# Patient Record
Sex: Female | Born: 1940
Health system: Southern US, Community
[De-identification: ages and names within clinical notes are randomized; demographics above are authoritative.]

## PROBLEM LIST (undated history)

## (undated) DIAGNOSIS — G629 Polyneuropathy, unspecified: Secondary | ICD-10-CM

## (undated) DIAGNOSIS — I10 Essential (primary) hypertension: Secondary | ICD-10-CM

## (undated) DIAGNOSIS — M1712 Unilateral primary osteoarthritis, left knee: Secondary | ICD-10-CM

## (undated) DIAGNOSIS — M1731 Unilateral post-traumatic osteoarthritis, right knee: Secondary | ICD-10-CM

## (undated) DIAGNOSIS — Z96652 Presence of left artificial knee joint: Secondary | ICD-10-CM

## (undated) DIAGNOSIS — F32A Depression, unspecified: Secondary | ICD-10-CM

## (undated) DIAGNOSIS — N393 Stress incontinence (female) (male): Secondary | ICD-10-CM

## (undated) DIAGNOSIS — K219 Gastro-esophageal reflux disease without esophagitis: Secondary | ICD-10-CM

## (undated) DIAGNOSIS — F329 Major depressive disorder, single episode, unspecified: Secondary | ICD-10-CM

## (undated) DIAGNOSIS — G4730 Sleep apnea, unspecified: Secondary | ICD-10-CM

## (undated) DIAGNOSIS — S92902A Unspecified fracture of left foot, initial encounter for closed fracture: Secondary | ICD-10-CM

## (undated) DIAGNOSIS — N939 Abnormal uterine and vaginal bleeding, unspecified: Secondary | ICD-10-CM

## (undated) DIAGNOSIS — E669 Obesity, unspecified: Secondary | ICD-10-CM

## (undated) DIAGNOSIS — M6530 Trigger finger, unspecified finger: Secondary | ICD-10-CM

## (undated) HISTORY — DX: Unilateral primary osteoarthritis, left knee: M17.12

## (undated) HISTORY — PX: BACK SURGERY: SHX140

## (undated) HISTORY — DX: Obesity, unspecified: E66.9

## (undated) HISTORY — DX: Stress incontinence (female) (male): N39.3

## (undated) HISTORY — DX: Gastro-esophageal reflux disease without esophagitis: K21.9

## (undated) HISTORY — DX: Sleep apnea, unspecified: G47.30

## (undated) HISTORY — PX: APPENDECTOMY: SHX54

## (undated) HISTORY — DX: Essential (primary) hypertension: I10

## (undated) HISTORY — PX: TUBAL LIGATION: SHX77

## (undated) HISTORY — DX: Abnormal uterine and vaginal bleeding, unspecified: N93.9

## (undated) HISTORY — PX: BLADDER SURGERY: SHX569

## (undated) HISTORY — DX: Unspecified fracture of left foot, initial encounter for closed fracture: S92.902A

## (undated) HISTORY — DX: Trigger finger, unspecified finger: M65.30

---

## 1985-06-26 HISTORY — PX: BREAST EXCISIONAL BIOPSY: SUR124

## 1993-05-26 HISTORY — PX: LUMBAR DISC SURGERY: SHX700

## 1999-01-20 ENCOUNTER — Other Ambulatory Visit: Admission: RE | Admit: 1999-01-20 | Discharge: 1999-01-20 | Payer: Self-pay | Admitting: Obstetrics and Gynecology

## 2000-03-15 ENCOUNTER — Encounter: Admission: RE | Admit: 2000-03-15 | Discharge: 2000-03-15 | Payer: Self-pay | Admitting: Internal Medicine

## 2000-03-15 ENCOUNTER — Encounter: Payer: Self-pay | Admitting: Internal Medicine

## 2000-06-20 ENCOUNTER — Other Ambulatory Visit: Admission: RE | Admit: 2000-06-20 | Discharge: 2000-06-20 | Payer: Self-pay | Admitting: Obstetrics and Gynecology

## 2001-03-21 ENCOUNTER — Encounter: Payer: Self-pay | Admitting: Internal Medicine

## 2001-03-21 ENCOUNTER — Encounter: Admission: RE | Admit: 2001-03-21 | Discharge: 2001-03-21 | Payer: Self-pay | Admitting: Internal Medicine

## 2001-10-09 ENCOUNTER — Other Ambulatory Visit: Admission: RE | Admit: 2001-10-09 | Discharge: 2001-10-09 | Payer: Self-pay | Admitting: Obstetrics and Gynecology

## 2002-03-24 ENCOUNTER — Encounter: Admission: RE | Admit: 2002-03-24 | Discharge: 2002-03-24 | Payer: Self-pay | Admitting: Internal Medicine

## 2002-03-24 ENCOUNTER — Encounter: Payer: Self-pay | Admitting: Internal Medicine

## 2002-06-26 HISTORY — PX: ANKLE FRACTURE SURGERY: SHX122

## 2002-12-19 ENCOUNTER — Other Ambulatory Visit: Admission: RE | Admit: 2002-12-19 | Discharge: 2002-12-19 | Payer: Self-pay | Admitting: Obstetrics and Gynecology

## 2003-05-07 ENCOUNTER — Encounter: Admission: RE | Admit: 2003-05-07 | Discharge: 2003-05-07 | Payer: Self-pay | Admitting: Obstetrics and Gynecology

## 2004-01-26 ENCOUNTER — Other Ambulatory Visit: Admission: RE | Admit: 2004-01-26 | Discharge: 2004-01-26 | Payer: Self-pay | Admitting: Obstetrics and Gynecology

## 2004-06-08 ENCOUNTER — Encounter: Admission: RE | Admit: 2004-06-08 | Discharge: 2004-06-08 | Payer: Self-pay | Admitting: Obstetrics and Gynecology

## 2005-05-02 ENCOUNTER — Other Ambulatory Visit: Admission: RE | Admit: 2005-05-02 | Discharge: 2005-05-02 | Payer: Self-pay | Admitting: Obstetrics and Gynecology

## 2005-06-28 ENCOUNTER — Encounter: Admission: RE | Admit: 2005-06-28 | Discharge: 2005-06-28 | Payer: Self-pay | Admitting: Internal Medicine

## 2006-07-03 ENCOUNTER — Encounter: Admission: RE | Admit: 2006-07-03 | Discharge: 2006-07-03 | Payer: Self-pay | Admitting: Internal Medicine

## 2006-07-20 ENCOUNTER — Other Ambulatory Visit: Admission: RE | Admit: 2006-07-20 | Discharge: 2006-07-20 | Payer: Self-pay | Admitting: Obstetrics & Gynecology

## 2007-07-08 ENCOUNTER — Encounter: Admission: RE | Admit: 2007-07-08 | Discharge: 2007-07-08 | Payer: Self-pay | Admitting: Internal Medicine

## 2007-09-26 ENCOUNTER — Other Ambulatory Visit: Admission: RE | Admit: 2007-09-26 | Discharge: 2007-09-26 | Payer: Self-pay | Admitting: Obstetrics & Gynecology

## 2008-07-08 ENCOUNTER — Encounter: Admission: RE | Admit: 2008-07-08 | Discharge: 2008-07-08 | Payer: Self-pay | Admitting: Internal Medicine

## 2009-07-09 ENCOUNTER — Encounter: Admission: RE | Admit: 2009-07-09 | Discharge: 2009-07-09 | Payer: Self-pay

## 2010-07-11 ENCOUNTER — Encounter
Admission: RE | Admit: 2010-07-11 | Discharge: 2010-07-11 | Payer: Self-pay | Source: Home / Self Care | Attending: Internal Medicine | Admitting: Internal Medicine

## 2011-07-03 ENCOUNTER — Other Ambulatory Visit: Payer: Self-pay | Admitting: Internal Medicine

## 2011-07-03 DIAGNOSIS — Z1231 Encounter for screening mammogram for malignant neoplasm of breast: Secondary | ICD-10-CM

## 2011-07-19 ENCOUNTER — Ambulatory Visit
Admission: RE | Admit: 2011-07-19 | Discharge: 2011-07-19 | Disposition: A | Discharge status: Home / Self Care | Payer: Medicare Other | Source: Ambulatory Visit | Attending: Internal Medicine | Admitting: Internal Medicine

## 2011-07-19 DIAGNOSIS — Z1231 Encounter for screening mammogram for malignant neoplasm of breast: Secondary | ICD-10-CM

## 2011-12-25 HISTORY — PX: KNEE ARTHROSCOPY: SHX127

## 2012-06-26 HISTORY — PX: EYE SURGERY: SHX253

## 2012-07-16 ENCOUNTER — Other Ambulatory Visit: Payer: Self-pay | Admitting: Internal Medicine

## 2012-07-16 DIAGNOSIS — Z1231 Encounter for screening mammogram for malignant neoplasm of breast: Secondary | ICD-10-CM

## 2012-08-19 ENCOUNTER — Ambulatory Visit
Admission: RE | Admit: 2012-08-19 | Discharge: 2012-08-19 | Disposition: A | Discharge status: Home / Self Care | Payer: BC Managed Care – PPO | Source: Ambulatory Visit | Attending: Internal Medicine | Admitting: Internal Medicine

## 2012-08-19 DIAGNOSIS — Z1231 Encounter for screening mammogram for malignant neoplasm of breast: Secondary | ICD-10-CM

## 2013-02-19 ENCOUNTER — Encounter: Payer: Self-pay | Admitting: Certified Nurse Midwife

## 2013-02-19 ENCOUNTER — Ambulatory Visit (INDEPENDENT_AMBULATORY_CARE_PROVIDER_SITE_OTHER): Payer: MEDICARE | Admitting: Certified Nurse Midwife

## 2013-02-19 VITALS — BP 120/62 | HR 72 | Resp 16 | Ht 63.0 in | Wt 235.0 lb

## 2013-02-19 DIAGNOSIS — Z01419 Encounter for gynecological examination (general) (routine) without abnormal findings: Secondary | ICD-10-CM

## 2013-02-19 DIAGNOSIS — Z124 Encounter for screening for malignant neoplasm of cervix: Secondary | ICD-10-CM

## 2013-02-19 NOTE — Progress Notes (Signed)
Patient ID: Jennifer Dyer, female   DOB: July 19, 1940, 72 y.o.   MRN: [id number] 72 y.o. [id number] Divorced.CaucasianF here for annual exam.  Menopausal, no HRT. Denies vaginal bleeding or dryness. Had Lichen sceloros flare, treated with Clobetasol with good response. Uses OTC Vagisil moisturizer and tub bath to keep area moist. Has left knee replacement scheduled due to pain increase.  Sees PCP for aex, labs and medication management of hypertension. No health issues today.  No LMP recorded. Patient is postmenopausal.          Sexually active: no  The current method of family planning is abstinence.    Exercising: yes  Gym/ health club routine includes water aerobic.  None in two months due to knee pain.. Smoker:  no  Health Maintenance: Pap:  12/03/09, WNL History of abnormal Pap:  no MMG:  08/20/12, BI-Rads 1 negative Colonoscopy:  2007, normal  PCP dispenses OC light BMD:   2005, 2010 PCP manages TDaP:  11/2012 Screening Labs: PCP, Hb today: PCP, Urine today: PCP   reports that she has never smoked. She has never used smokeless tobacco. She reports that she does not drink alcohol or use illicit drugs.  Past Medical History  Diagnosis Date  . HTN (hypertension)   . Obesity   . Abnormal uterine bleeding (AUB)     on HRT  . SUI (stress urinary incontinence, female)     Past Surgical History  Procedure Laterality Date  . Appendectomy    . Knee arthroscopy Left 12/2011  . Tubal ligation Bilateral   . Lumbar disc surgery  12/94    Current Outpatient Prescriptions  Medication Sig Dispense Refill  . amLODipine (NORVASC) 5 MG tablet Take 1 tablet by mouth daily.      Marland Kitchen aspirin EC 81 MG tablet Take 81 mg by mouth daily.      . Cholecalciferol (VITAMIN D) 2000 UNITS CAPS Take 1 capsule by mouth daily.      . citalopram (CELEXA) 20 MG tablet Take 1 tablet by mouth daily.      . Cranberry 475 MG CAPS Take 1 capsule by mouth daily.      . Multiple Vitamin (MULTIVITAMIN) tablet Take 1 tablet  by mouth daily.       No current facility-administered medications for this visit.    History reviewed. No pertinent family history.  ROS:  Pertinent items are noted in HPI.  Otherwise, a comprehensive ROS was negative.  Exam:   BP 120/62  Pulse 72  Resp 16  Ht 5\' 3"  (1.6 m)  Wt 235 lb (106.595 kg)  BMI 41.64 kg/m2  Weight change: @WEIGHTCHANGE @ Height:   Height: 5\' 3"  (160 cm)  Ht Readings from Last 3 Encounters:  02/19/13 5\' 3"  (1.6 m)    General appearance: alert, cooperative and appears stated age Head: Normocephalic, without obvious abnormality, atraumatic Neck: no adenopathy, supple, symmetrical, trachea midline and thyroid normal to inspection and palpation Lungs: clear to auscultation bilaterally Breasts: normal appearance, no masses or tenderness, No nipple retraction or dimpling, No nipple discharge or bleeding, No axillary or supraclavicular adenopathy Heart: regular rate and rhythm Abdomen: soft, non-tender; bowel sounds normal; no masses,  no organomegaly Extremities: extremities normal, atraumatic, no cyanosis or edema Skin: Skin color, texture, turgor normal. No rashes or lesions Lymph nodes: Cervical, supraclavicular, and axillary nodes normal. No abnormal inguinal nodes palpated Neurologic: Grossly normal   Pelvic: External genitalia:  no lesions  Urethra:  normal appearing urethra with no masses, tenderness or lesions              Bartholins and Skenes: normal                 Vagina: normal appearing vagina with normal color and discharge, no lesions              Cervix: normal, non tender              Pap taken: no Bimanual Exam:  Uterus:  normal size, contour, position, consistency, mobility, non-tender and anteverted              Adnexa: normal adnexa and no mass, fullness, tenderness               Rectovaginal: Confirms               Anus:  normal sphincter tone, no lesions  A:  Well Woman with normal exam  Menopausal, no  HRT  Hypertension stable medication with PCP management  Lichen Selerous, chronic recent flare  P:   Reviewed health and wellness pertinent to exam  Aware of need to advise if vaginal bleeding  Continue follow up as indicated  Has Rx for use if needed  Mammogram yearly pap smear not taken today  counseled on breast self exam, mammography screening, adequate intake of calcium and vitamin D, diet and exercise, Kegel's exercises return annually or prn  An After Visit Summary was printed and given to the patient.

## 2013-02-19 NOTE — Patient Instructions (Addendum)

## 2013-02-20 NOTE — Progress Notes (Signed)
Note reviewed, agree with plan.  Dequon Schnebly, MD  

## 2013-03-10 ENCOUNTER — Encounter (HOSPITAL_COMMUNITY): Payer: Self-pay | Admitting: Pharmacy Technician

## 2013-03-12 ENCOUNTER — Encounter: Payer: Self-pay | Admitting: Physician Assistant

## 2013-03-12 ENCOUNTER — Other Ambulatory Visit: Payer: Self-pay | Admitting: Physician Assistant

## 2013-03-12 DIAGNOSIS — I10 Essential (primary) hypertension: Secondary | ICD-10-CM

## 2013-03-12 DIAGNOSIS — E669 Obesity, unspecified: Secondary | ICD-10-CM

## 2013-03-12 DIAGNOSIS — N393 Stress incontinence (female) (male): Secondary | ICD-10-CM

## 2013-03-12 NOTE — H&P (Signed)
TOTAL KNEE ADMISSION H&P  Patient is being admitted for left total knee arthroplasty.  Subjective:  Chief Complaint:left knee pain.  HPI: Jennifer Dyer, 72 y.o. female, has a history of pain and functional disability in the left knee due to arthritis and has failed non-surgical conservative treatments for greater than 12 weeks to includeNSAID's and/or analgesics, corticosteriod injections, viscosupplementation injections, flexibility and strengthening excercises, use of assistive devices, weight reduction as appropriate and activity modification.  Onset of symptoms was gradual, starting 10 years ago with gradually worsening course since that time. The patient noted prior procedures on the knee to include  arthroscopy and menisectomy on the left knee(s).  Patient currently rates pain in the left knee(s) at 10 out of 10 with activity. Patient has night pain, worsening of pain with activity and weight bearing, pain that interferes with activities of daily living, crepitus and joint swelling.  Patient has evidence of subchondral sclerosis, periarticular osteophytes and joint space narrowing by imaging studies.  There is no active infection.  Patient Active Problem List   Diagnosis Date Noted  . HTN (hypertension)   . Obesity   . SUI (stress urinary incontinence, female)    Past Medical History  Diagnosis Date  . HTN (hypertension)   . Obesity   . Abnormal uterine bleeding (AUB)   . SUI (stress urinary incontinence, female)   . Left knee DJD     Past Surgical History  Procedure Laterality Date  . Appendectomy    . Knee arthroscopy Left 12/2011  . Tubal ligation Bilateral   . Lumbar disc surgery  12/94     (Not in a hospital admission) No Known Allergies  History  Substance Use Topics  . Smoking status: Never Smoker   . Smokeless tobacco: Never Used  . Alcohol Use: No    Family History  Problem Relation Age of Onset  . Heart disease Mother   . Heart disease Father   . Heart  attack Mother   . Heart attack Father   . Stroke Mother   . Hypertension    . Clotting disorder Sister     sister died of a PE post op     Review of Systems  Constitutional: Negative.   HENT: Positive for congestion.        Allergies  Eyes: Negative.   Respiratory: Positive for cough.   Cardiovascular: Negative.   Gastrointestinal: Negative.   Genitourinary: Negative.   Musculoskeletal: Positive for back pain and joint pain.       Left knee pain  Skin: Negative.   Neurological: Negative.   Endo/Heme/Allergies: Negative.   Psychiatric/Behavioral: Negative.   All other systems reviewed and are negative.    Objective:  Physical Exam  Constitutional: She is oriented to person, place, and time. She appears well-developed and well-nourished.  HENT:  Head: Normocephalic and atraumatic.  Eyes: Conjunctivae and EOM are normal. Pupils are equal, round, and reactive to light.  Neck: Normal range of motion.  Cardiovascular: Normal rate, regular rhythm and normal heart sounds.   Respiratory: Effort normal and breath sounds normal.  GI: Soft. Bowel sounds are normal.  Genitourinary:  Not pertinent to current symptomatology therefore not examined.  Musculoskeletal:  Examination of her left knee reveals pain medially and laterally with valgus deformity 1+ synovitis 1+crepitation range of motion is 0-120 degrees with pain knee is stable with normal patella tracking. Exam of the right knee reveals full range of motion without pain swelling weakness or instability. Vascular exam: pulses  2+ and symmetric.  Neurological: She is alert and oriented to person, place, and time.  Skin: Skin is warm and dry.  Psychiatric: She has a normal mood and affect. Her behavior is normal. Thought content normal.    Vital signs in last 24 hours: Last recorded: 09/17 1500   BP: 141/71 Pulse: 66  Temp: 98.7 F (37.1 C)    Height: 5\' 3"  (1.6 m) SpO2: 97  Weight: 106.142 kg (234 lb)      Labs:   Estimated body mass index is 41.46 kg/(m^2) as calculated from the following:   Height as of this encounter: 5\' 3"  (1.6 m).   Weight as of this encounter: 106.142 kg (234 lb).   Imaging Review Plain radiographs demonstrate severe degenerative joint disease of the left knee(s). The overall alignment ismild valgus. The bone quality appears to be good for age and reported activity level.  Assessment/Plan:  End stage arthritis, left knee   The patient history, physical examination, clinical judgment of the provider and imaging studies are consistent with end stage degenerative joint disease of the left knee(s) and total knee arthroplasty is deemed medically necessary. The treatment options including medical management, injection therapy arthroscopy and arthroplasty were discussed at length. The risks and benefits of total knee arthroplasty were presented and reviewed. The risks due to aseptic loosening, infection, stiffness, patella tracking problems, thromboembolic complications and other imponderables were discussed. The patient acknowledged the explanation, agreed to proceed with the plan and consent was signed. Patient is being admitted for inpatient treatment for surgery, pain control, PT, OT, prophylactic antibiotics, VTE prophylaxis, progressive ambulation and ADL's and discharge planning. The patient is planning to be discharged to skilled nursing facility [facility]  Alik Mawson A. Gwinda Passe Physician Assistant [facility] [phone]  03/12/2013, 3:45 PM

## 2013-03-17 ENCOUNTER — Encounter (HOSPITAL_COMMUNITY): Payer: Self-pay

## 2013-03-17 ENCOUNTER — Ambulatory Visit (HOSPITAL_COMMUNITY)
Admission: RE | Admit: 2013-03-17 | Discharge: 2013-03-17 | Disposition: A | Discharge status: Home / Self Care | Payer: Medicare Other | Source: Ambulatory Visit | Attending: Physician Assistant | Admitting: Physician Assistant

## 2013-03-17 ENCOUNTER — Other Ambulatory Visit (HOSPITAL_COMMUNITY): Payer: Self-pay | Admitting: Orthopedic Surgery

## 2013-03-17 ENCOUNTER — Encounter (HOSPITAL_COMMUNITY)
Admission: RE | Admit: 2013-03-17 | Discharge: 2013-03-17 | Disposition: A | Discharge status: Home / Self Care | Payer: Medicare Other | Source: Ambulatory Visit | Attending: Orthopedic Surgery | Admitting: Orthopedic Surgery

## 2013-03-17 ENCOUNTER — Ambulatory Visit (HOSPITAL_COMMUNITY)
Admission: RE | Admit: 2013-03-17 | Discharge: 2013-03-17 | Disposition: A | Discharge status: Home / Self Care | Payer: Medicare Other | Source: Ambulatory Visit | Attending: Orthopedic Surgery | Admitting: Orthopedic Surgery

## 2013-03-17 DIAGNOSIS — Z01812 Encounter for preprocedural laboratory examination: Secondary | ICD-10-CM | POA: Insufficient documentation

## 2013-03-17 DIAGNOSIS — Z01818 Encounter for other preprocedural examination: Secondary | ICD-10-CM | POA: Insufficient documentation

## 2013-03-17 DIAGNOSIS — M79609 Pain in unspecified limb: Secondary | ICD-10-CM

## 2013-03-17 DIAGNOSIS — M25562 Pain in left knee: Secondary | ICD-10-CM

## 2013-03-17 DIAGNOSIS — M7989 Other specified soft tissue disorders: Secondary | ICD-10-CM | POA: Insufficient documentation

## 2013-03-17 HISTORY — DX: Depression, unspecified: F32.A

## 2013-03-17 HISTORY — DX: Major depressive disorder, single episode, unspecified: F32.9

## 2013-03-17 LAB — CBC WITH DIFFERENTIAL/PLATELET
Basophils Relative: 1 % (ref 0–1)
Eosinophils Relative: 5 % (ref 0–5)
HCT: 39.9 % (ref 36.0–46.0)
Lymphocytes Relative: 26 % (ref 12–46)
Lymphs Abs: 1.7 10*3/uL (ref 0.7–4.0)
MCHC: 33.8 g/dL (ref 30.0–36.0)
MCV: 84.7 fL (ref 78.0–100.0)
Neutro Abs: 3.8 10*3/uL (ref 1.7–7.7)
Neutrophils Relative %: 61 % (ref 43–77)
Platelets: 279 10*3/uL (ref 150–400)
RBC: 4.71 MIL/uL (ref 3.87–5.11)
WBC: 6.3 10*3/uL (ref 4.0–10.5)

## 2013-03-17 LAB — URINALYSIS, ROUTINE W REFLEX MICROSCOPIC
Bilirubin Urine: NEGATIVE
Glucose, UA: NEGATIVE mg/dL
Hgb urine dipstick: NEGATIVE
Protein, ur: NEGATIVE mg/dL
Specific Gravity, Urine: 1.02 (ref 1.005–1.030)
pH: 6.5 (ref 5.0–8.0)

## 2013-03-17 LAB — COMPREHENSIVE METABOLIC PANEL
ALT: 9 U/L (ref 0–35)
Alkaline Phosphatase: 77 U/L (ref 39–117)
CO2: 23 mEq/L (ref 19–32)
Calcium: 9.7 mg/dL (ref 8.4–10.5)
Chloride: 103 mEq/L (ref 96–112)
GFR calc Af Amer: >90 mL/min (ref >90)
GFR calc non Af Amer: 83 mL/min — ABNORMAL LOW (ref >90)
Glucose, Bld: 79 mg/dL (ref 70–99)
Potassium: 3.8 mEq/L (ref 3.5–5.1)
Sodium: 138 mEq/L (ref 135–145)
Total Bilirubin: 0.5 mg/dL (ref 0.3–1.2)

## 2013-03-17 LAB — SURGICAL PCR SCREEN: Staphylococcus aureus: NEGATIVE

## 2013-03-17 LAB — URINE MICROSCOPIC-ADD ON

## 2013-03-17 LAB — APTT: aPTT: 30 seconds (ref 24–37)

## 2013-03-17 LAB — ABO/RH: ABO/RH(D): A POS

## 2013-03-17 LAB — TYPE AND SCREEN: ABO/RH(D): A POS

## 2013-03-17 NOTE — Progress Notes (Signed)
Office called [id number] to get ted hose in calf size needed for patient

## 2013-03-17 NOTE — Pre-Procedure Instructions (Addendum)
Jennifer Dyer  03/17/2013   Your procedure is scheduled on:  03/24/13  Report to [facility] hospital admissions at [address]  at 800AM.  Call this number if you have problems the morning of surgery: [phone]   Remember:   Do not eat food or drink liquids after midnight.   Take these medicines the morning of surgery with A SIP OF WATER: amlodipine, celexa, mirabeqron          STOP aspirin 03/19/13   Do not wear jewelry, make-up or nail polish.  Do not wear lotions, powders, or perfumes. You may wear deodorant.  Do not shave 48 hours prior to surgery. Men may shave face and neck.  Do not bring valuables to the hospital.  [facility] is not responsible                   for any belongings or valuables.  Contacts, dentures or bridgework may not be worn into surgery.  Leave suitcase in the car. After surgery it may be brought to your room.  For patients admitted to the hospital, checkout time is 11:00 AM the day of  discharge.   Patients discharged the day of surgery will not be allowed to drive  home.  Name and phone number of your driver:   Special Instructions: Incentive Spirometry - Practice and bring it with you on the day of surgery. Shower using CHG 2 nights before surgery and the night before surgery.  If you shower the day of surgery use CHG.  Use special wash - you have one bottle of CHG for all showers.  You should use approximately 1/3 of the bottle for each shower.   Please read over the following fact sheets that you were given: Pain Booklet, Coughing and Deep Breathing, Blood Transfusion Information, Total Joint Packet, MRSA Information and Surgical Site Infection Prevention

## 2013-03-17 NOTE — Progress Notes (Signed)
*  Preliminary Results* Left lower extremity venous duplex completed. Left lower extremity is negative for deep vein thrombosis. There is no evidence of left Baker's cyst.  Attempted to call preliminary results to Dr.Wainer's office, however there was no answer. The patient has been discharged and can be reached by phone if necessary.  03/17/2013 6:29 PM  Gertie Fey, RVT, RDCS, RDMS

## 2013-03-18 ENCOUNTER — Inpatient Hospital Stay (HOSPITAL_COMMUNITY): Admission: RE | Admit: 2013-03-18 | Payer: Medicare Other | Source: Ambulatory Visit

## 2013-03-20 LAB — URINE CULTURE: Colony Count: >=100000

## 2013-03-24 ENCOUNTER — Encounter (HOSPITAL_COMMUNITY): Admission: RE | Payer: Self-pay | Source: Ambulatory Visit

## 2013-03-24 ENCOUNTER — Inpatient Hospital Stay (HOSPITAL_COMMUNITY): Admission: RE | Admit: 2013-03-24 | Payer: Medicare Other | Source: Ambulatory Visit | Admitting: Orthopedic Surgery

## 2013-03-24 SURGERY — ARTHROPLASTY, KNEE, TOTAL
Anesthesia: General | Laterality: Left

## 2013-05-02 ENCOUNTER — Encounter: Payer: Self-pay | Admitting: Nurse Practitioner

## 2013-05-02 ENCOUNTER — Ambulatory Visit (INDEPENDENT_AMBULATORY_CARE_PROVIDER_SITE_OTHER): Payer: MEDICARE | Admitting: Nurse Practitioner

## 2013-05-02 VITALS — BP 140/84 | HR 64 | Temp 98.2°F | Ht 63.0 in | Wt 234.0 lb

## 2013-05-02 DIAGNOSIS — R300 Dysuria: Secondary | ICD-10-CM

## 2013-05-02 LAB — POCT URINALYSIS DIPSTICK
Bilirubin, UA: NEGATIVE
Glucose, UA: NEGATIVE
Nitrite, UA: NEGATIVE
Urobilinogen, UA: NEGATIVE

## 2013-05-02 MED ORDER — FLUCONAZOLE 150 MG PO TABS: 150.0000 mg | ORAL_TABLET | Freq: Once | ORAL | Status: DC

## 2013-05-02 MED ORDER — CIPROFLOXACIN HCL 500 MG PO TABS: 500.0000 mg | ORAL_TABLET | Freq: Two times a day (BID) | ORAL | Status: DC

## 2013-05-02 NOTE — Progress Notes (Signed)
Subjective:     Patient ID: Jennifer Dyer, female   DOB: July 14, 1940, 72 y.o.   MRN: [id number]  HPI  72 yo WD Fe presents with UTI symptoms urgency, frequency and dysuria.   She had UTI with last AEX and was treated with antibiotic unsure of name.  Then saw Dr. Brynda Rim PCP and was stated on Myrbetriq for urge incontinence which she thinks has helped.  She then sent to Ortho for presurgical knee replacement.  There her urine was also abnormal but they did not start on med's.  Comes in today to get UTI treated before her upcoming surgery in December.   Review of Systems  Constitutional: Positive for chills. Negative for fever and fatigue.  Respiratory: Negative.   Cardiovascular: Negative.   Gastrointestinal: Negative.  Negative for nausea, vomiting, abdominal pain, diarrhea, constipation and abdominal distention.  Genitourinary: Negative.   Musculoskeletal: Negative.   Skin: Negative.   Neurological: Negative.   Psychiatric/Behavioral: Negative.        Objective:   Physical Exam  Constitutional: She is oriented to person, place, and time. She appears well-developed and well-nourished.  Pulmonary/Chest: Effort normal.  Abdominal: Soft. She exhibits no distension. There is no tenderness. There is no rebound and no guarding.  Neurological: She is alert and oriented to person, place, and time.  Psychiatric: She has a normal mood and affect. Her behavior is normal. Judgment and thought content normal.       Assessment:    UTI    Plan:     Cipro 500 mg BID for a week Send urine for C & S and follow Warnings signs and symptoms of worsening  symptoms

## 2013-05-02 NOTE — Patient Instructions (Addendum)
Urinary Tract Infection Urinary tract infections (UTIs) can develop anywhere along your urinary tract. Your urinary tract is your body's drainage system for removing wastes and extra water. Your urinary tract includes two kidneys, two ureters, a bladder, and a urethra. Your kidneys are a pair of bean-shaped organs. Each kidney is about the size of your fist. They are located below your ribs, one on each side of your spine. CAUSES Infections are caused by microbes, which are microscopic organisms, including fungi, viruses, and bacteria. These organisms are so small that they can only be seen through a microscope. Bacteria are the microbes that most commonly cause UTIs. SYMPTOMS  Symptoms of UTIs may vary by age and gender of the patient and by the location of the infection. Symptoms in young women typically include a frequent and intense urge to urinate and a painful, burning feeling in the bladder or urethra during urination. Older women and men are more likely to be tired, shaky, and weak and have muscle aches and abdominal pain. A fever may mean the infection is in your kidneys. Other symptoms of a kidney infection include pain in your back or sides below the ribs, nausea, and vomiting. DIAGNOSIS To diagnose a UTI, your caregiver will ask you about your symptoms. Your caregiver also will ask to provide a urine sample. The urine sample will be tested for bacteria and white blood cells. White blood cells are made by your body to help fight infection. TREATMENT  Typically, UTIs can be treated with medication. Because most UTIs are caused by a bacterial infection, they usually can be treated with the use of antibiotics. The choice of antibiotic and length of treatment depend on your symptoms and the type of bacteria causing your infection. HOME CARE INSTRUCTIONS  If you were prescribed antibiotics, take them exactly as your caregiver instructs you. Finish the medication even if you feel better after you  have only taken some of the medication.  Drink enough water and fluids to keep your urine clear or pale yellow.  Avoid caffeine, tea, and carbonated beverages. They tend to irritate your bladder.  Empty your bladder often. Avoid holding urine for long periods of time.  Empty your bladder before and after sexual intercourse.  After a bowel movement, women should cleanse from front to back. Use each tissue only once. SEEK MEDICAL CARE IF:   You have back pain.  You develop a fever.  Your symptoms do not begin to resolve within 3 days. SEEK IMMEDIATE MEDICAL CARE IF:   You have severe back pain or lower abdominal pain.  You develop chills.  You have nausea or vomiting.  You have continued burning or discomfort with urination. MAKE SURE YOU:   Understand these instructions.  Will watch your condition.  Will get help right away if you are not doing well or get worse. Document Released: 03/22/2005 Document Revised: 12/12/2011 Document Reviewed: 07/21/2011 [facility] Patient Information 2014 [place], [place].    Be sure to see PCP about your BP - because of being on Myrbetriq

## 2013-05-04 LAB — URINE CULTURE: Colony Count: >=100000

## 2013-05-04 NOTE — Progress Notes (Signed)
Encounter reviewed by Dr. Lilliauna Van Silva.  

## 2013-05-06 ENCOUNTER — Encounter: Payer: Self-pay | Admitting: Certified Nurse Midwife

## 2013-05-12 ENCOUNTER — Telehealth: Payer: Self-pay | Admitting: Nurse Practitioner

## 2013-05-12 NOTE — Telephone Encounter (Signed)
Calling for results from uti.

## 2013-05-12 NOTE — Telephone Encounter (Signed)
Spoke with pt about urine culture. Advised pt she should finish all of medication and return next week for TOC with PG. Pt agreeable. Sched OV 05-19-13 at 11:15.

## 2013-05-19 ENCOUNTER — Ambulatory Visit (INDEPENDENT_AMBULATORY_CARE_PROVIDER_SITE_OTHER): Payer: MEDICARE | Admitting: Nurse Practitioner

## 2013-05-19 ENCOUNTER — Encounter: Payer: Self-pay | Admitting: Nurse Practitioner

## 2013-05-19 VITALS — BP 140/86 | HR 64 | Ht 63.0 in | Wt 237.0 lb

## 2013-05-19 DIAGNOSIS — N390 Urinary tract infection, site not specified: Secondary | ICD-10-CM

## 2013-05-19 LAB — POCT URINALYSIS DIPSTICK
Bilirubin, UA: NEGATIVE
Ketones, UA: NEGATIVE
Leukocytes, UA: NEGATIVE
Protein, UA: NEGATIVE

## 2013-05-19 MED ORDER — CLOBETASOL PROPIONATE 0.05 % EX OINT: 1.0000 "application " | TOPICAL_OINTMENT | Freq: Two times a day (BID) | CUTANEOUS | Status: DC

## 2013-05-19 NOTE — Progress Notes (Deleted)
Subjective:     Patient ID: Jennifer Dyer, female   DOB: 03-31-1941, 72 y.o.   MRN: [id number]  HPI  This 72 yo WM Fe   Subjective:     Patient ID: Jennifer Dyer, female   DOB: [birth year], 72 y.o.   MRN: [id number]  HPI  72 yo WD Fe presented with UTI symptoms urgency, frequency and dysuria on  05/02/13.  She had  positive E coli UTI and was treated with Cipro.   Comes in today to get UTI TOC checked before her upcoming surgery in December.  She had UTI with last AEX and was treated with antibiotic unsure of name.  Then saw Dr. Brynda Rim PCP and was stated on Myrbetriq for urge incontinence which she thinks has helped.  She then sent to Ortho for presurgical knee replacement.  There her urine was also abnormal but they did not start on med's.    Review of Systems  Constitutional: Negative for fever/chills and fatigue.  Respiratory: Negative.   Cardiovascular: Negative.   Gastrointestinal: Negative.  Negative for nausea, vomiting, abdominal pain, diarrhea, constipation and abdominal distention.  Genitourinary: Negative.   Musculoskeletal: Negative.   Skin: Negative.   Neurological: Negative.         Objective:   Physical Exam  Constitutional: She is oriented to person, place, and time. She appears well-developed and well-nourished.  Abdominal: Soft. She exhibits no distension. There is no tenderness. There is no rebound and no guarding.  Neurological: She is alert and oriented to person, place, and time.  Psychiatric: She has a normal mood and affect. Her behavior is normal. Judgment and thought content normal.       Assessment:    UTI TOC    Plan:      Send urine for C & S and follow Warnings signs and symptoms of worsening  Symptoms She has a friend who takes a preventative meds and she is interested about this sounds like it is Urised - she will verify and call back.  Anything that she can do to pursue having her knee surgery on 12/15 would be helpful.        Review of  Systems  Constitutional: Positive for chills. Negative for fever and fatigue.  Respiratory: Negative.   Cardiovascular: Negative.   Gastrointestinal: Negative.  Negative for nausea, vomiting, abdominal pain, diarrhea, constipation and abdominal distention.  Genitourinary: Negative.   Musculoskeletal: Negative.   Skin: Negative.   Neurological: Negative.   Psychiatric/Behavioral: Negative.       Review of Systems  Constitutional:  Negative for fever and fatigue.  Respiratory: Negative.   Cardiovascular: Negative.   Gastrointestinal: Negative.  Negative for nausea, vomiting, abdominal pain, diarrhea, constipation and abdominal distention.  Genitourinary: Negative. She does have a flare of LSA and needs  a refill on Temovate. Musculoskeletal: Negative.   Skin: Negative.   Neurological: Negative.   Psychiatric/Behavioral: Negative Objective:   Physical Exam  Constitutional: She is oriented to person, place, and time. She appears well-developed and well-nourished.  Pulmonary/Chest: Effort normal.  Abdominal: Soft. She exhibits no distension. There is no tenderness. There is no rebound and no guarding.  Neurological: She is alert and oriented to person, place, and time.  Psychiatric: She has a normal mood and affect. Her behavior is normal. Judgment and thought content normal.            Plan:

## 2013-05-21 ENCOUNTER — Telehealth: Payer: Self-pay | Admitting: *Deleted

## 2013-05-21 NOTE — Telephone Encounter (Signed)
Patient is returning Stephanie's call

## 2013-05-21 NOTE — Telephone Encounter (Signed)
I have attempted to contact this patient by phone with the following results: left message to return my call on answering machine (home).  

## 2013-05-21 NOTE — Telephone Encounter (Signed)
Message copied by Luisa Dago on Wed May 21, 2013 11:01 AM ------      Message from: Ria Comment R      Created: Tue May 20, 2013  7:07 PM       Let patient know urine C & S is negative ------

## 2013-05-26 NOTE — Progress Notes (Signed)
Patient ID: Jennifer Dyer, female   DOB: 1940/08/03, 72 y.o.   MRN: [id number]  Subjective:     Patient ID: Jennifer Dyer, female   DOB: [birth year], 72 y.o.   MRN: [id number]  HPI  72 yo WD Fe presented with UTI symptoms urgency, frequency and dysuria on  05/02/13.  She had  positive E coli UTI and was treated with Cipro.   Comes in today to get UTI TOC checked before her upcoming surgery in December.  She had UTI with last AEX and was treated with antibiotic unsure of name.  Then saw Dr. Brynda Rim PCP and was stated on Myrbetriq for urge incontinence which she thinks has helped.  She then sent to Ortho for presurgical knee replacement.  There her urine was also abnormal but they did not start on med's.    Review of Systems  Constitutional: Negative for fever/chills and fatigue.  Respiratory: Negative.   Cardiovascular: Negative.   Gastrointestinal: Negative.  Negative for nausea, vomiting, abdominal pain, diarrhea, constipation and abdominal distention.  Genitourinary: Negative.   Musculoskeletal: Negative.   Skin: Negative.   Neurological: Negative.         Objective:   Physical Exam  Constitutional: She is oriented to person, place, and time. She appears well-developed and well-nourished.  Abdominal: Soft. She exhibits no distension. There is no tenderness. There is no rebound and no guarding.  Neurological: She is alert and oriented to person, place, and time.  Psychiatric: She has a normal mood and affect. Her behavior is normal. Judgment and thought content normal.       Assessment:    UTI TOC    Plan:      Send urine for C & S and follow Warnings signs and symptoms of worsening  Symptoms She has a friend who takes a preventative med's and she is interested about this sounds like it is Urised - she will verify and call back.  Anything that she can do to pursue having her knee surgery on 12/15 would be helpful.

## 2013-05-26 NOTE — Telephone Encounter (Signed)
Pt returning call

## 2013-05-26 NOTE — Telephone Encounter (Signed)
I have attempted to contact this patient by phone with the following results: left message to return my call on answering machine (home).  

## 2013-05-27 NOTE — Telephone Encounter (Signed)
Pt notified of results.  Pt voices understanding and is agreeable with plan. 

## 2013-05-28 ENCOUNTER — Encounter (HOSPITAL_COMMUNITY): Payer: Self-pay | Admitting: Pharmacy Technician

## 2013-05-28 ENCOUNTER — Other Ambulatory Visit: Payer: Self-pay | Admitting: Physician Assistant

## 2013-05-28 ENCOUNTER — Encounter: Payer: Self-pay | Admitting: Physician Assistant

## 2013-05-28 DIAGNOSIS — F329 Major depressive disorder, single episode, unspecified: Secondary | ICD-10-CM

## 2013-05-28 DIAGNOSIS — F32A Depression, unspecified: Secondary | ICD-10-CM | POA: Insufficient documentation

## 2013-05-28 DIAGNOSIS — M1712 Unilateral primary osteoarthritis, left knee: Secondary | ICD-10-CM | POA: Insufficient documentation

## 2013-05-28 DIAGNOSIS — N939 Abnormal uterine and vaginal bleeding, unspecified: Secondary | ICD-10-CM | POA: Insufficient documentation

## 2013-05-28 NOTE — H&P (Signed)
TOTAL KNEE ADMISSION H&P  Patient is being admitted for left total knee arthroplasty.  Subjective:  Chief Complaint:left knee pain.  HPI: Jennifer Dyer, 72 y.o. female, has a history of pain and functional disability in the left knee due to arthritis and has failed non-surgical conservative treatments for greater than 12 weeks to includeNSAID's and/or analgesics, corticosteriod injections, viscosupplementation injections, flexibility and strengthening excercises, supervised PT with diminished ADL's post treatment, use of assistive devices and activity modification.  Onset of symptoms was gradual, starting >10 years ago with gradually worsening course since that time. The patient noted no past surgery on the left knee(s).  Patient currently rates pain in the left knee(s) at 10 out of 10 with activity. Patient has night pain, worsening of pain with activity and weight bearing, pain that interferes with activities of daily living, crepitus and joint swelling.  Patient has evidence of subchondral sclerosis, periarticular osteophytes and joint space narrowing by imaging studies. . There is no active infection.  Patient Active Problem List   Diagnosis Date Noted  . Abnormal uterine bleeding (AUB)   . Left knee DJD   . Depression   . HTN (hypertension)   . Obesity   . SUI (stress urinary incontinence, female)    Past Medical History  Diagnosis Date  . HTN (hypertension)   . Obesity   . Abnormal uterine bleeding (AUB)   . SUI (stress urinary incontinence, female)   . Left knee DJD   . Depression     Past Surgical History  Procedure Laterality Date  . Appendectomy    . Knee arthroscopy Left 12/2011  . Tubal ligation Bilateral   . Lumbar disc surgery  12/94  . Ankle fracture surgery Left 04    fusion     (Not in a hospital admission) No Known Allergies   Meds ordered this encounter  Medications  . mirabegron ER (MYRBETRIQ) 50 MG TB24 tablet    Sig: Take 50 mg by mouth daily.    Current Outpatient Prescriptions on File Prior to Visit  Medication Sig Dispense Refill  . amLODipine (NORVASC) 5 MG tablet Take 5 mg by mouth daily.      Marland Kitchen aspirin EC 81 MG tablet Take 81 mg by mouth daily.      . calcium carbonate 1250 MG capsule Take 1,200 mg by mouth 2 (two) times daily with a meal.      . Cholecalciferol (VITAMIN D) 2000 UNITS CAPS Take 2,000 Units by mouth daily.       . citalopram (CELEXA) 20 MG tablet Take 20 mg by mouth daily.       Marland Kitchen losartan-hydrochlorothiazide (HYZAAR) 50-12.5 MG per tablet Take 1 tablet by mouth daily.       . Multiple Vitamin (MULTIVITAMIN) tablet Take 1 tablet by mouth daily.      . clobetasol ointment (TEMOVATE) 0.05 % Apply 1 application topically 2 (two) times daily.  60 g  1  . fish oil-omega-3 fatty acids 1000 MG capsule Take 2,400 mg by mouth 2 (two) times daily.       No current facility-administered medications on file prior to visit.    History  Substance Use Topics  . Smoking status: Never Smoker   . Smokeless tobacco: Never Used  . Alcohol Use: No    Family History  Problem Relation Age of Onset  . Heart disease Mother   . Heart disease Father   . Heart attack Mother   . Heart attack Father   .  Stroke Mother   . Hypertension    . Clotting disorder Sister     sister died of a PE post op     Review of Systems  Constitutional: Negative.   HENT: Negative.   Eyes: Negative.   Respiratory: Negative.   Gastrointestinal: Negative.   Genitourinary: Negative.   Musculoskeletal: Positive for joint pain.  Skin: Negative.   Neurological: Negative.   Endo/Heme/Allergies: Negative.   Psychiatric/Behavioral: Negative.     Objective:  Physical Exam  Constitutional: She is oriented to person, place, and time. She appears well-developed and well-nourished.  HENT:  Head: Normocephalic and atraumatic.  Mouth/Throat: Oropharynx is clear and moist.  Eyes: Conjunctivae are normal. Pupils are equal, round, and reactive to  light.  Neck: Normal range of motion. Neck supple.  Cardiovascular: Normal rate and regular rhythm.   Respiratory: Effort normal and breath sounds normal.  GI: Soft. Bowel sounds are normal.  Genitourinary:  Not pertinent to current symptomatology therefore not examined.  Musculoskeletal:  Examination of her left knee reveals pain medially and laterally with valgus deformity 1+ synovitis 1+crepitation range of motion is -5-120 degrees with pain knee is stable with normal patella tracking. Exam of the right knee reveals full range of motion with mild pain No swelling weakness or instability. Vascular exam: pulses 2+ and symmetric.  Neurological: She is alert and oriented to person, place, and time.  Skin: Skin is warm and dry.  Psychiatric: She has a normal mood and affect. Her behavior is normal. Thought content normal.    Vital signs in last 24 hours: Last recorded: 12/03 1500   BP: 145/85 Pulse: 98  Temp: 97.7 F (36.5 C)    Height: 5\' 3"  (1.6 m) SpO2: 95  Weight: 107.956 kg (238 lb)     Labs:   Estimated body mass index is 42.17 kg/(m^2) as calculated from the following:   Height as of this encounter: 5\' 3"  (1.6 m).   Weight as of this encounter: 107.956 kg (238 lb).   Imaging Review Plain radiographs demonstrate severe degenerative joint disease of the left knee(s). The overall alignment issignificant valgus. The bone quality appears to be good for age and reported activity level.  Assessment/Plan:  End stage arthritis, left knee   The patient history, physical examination, clinical judgment of the provider and imaging studies are consistent with end stage degenerative joint disease of the left knee(s) and total knee arthroplasty is deemed medically necessary. The treatment options including medical management, injection therapy arthroscopy and arthroplasty were discussed at length. The risks and benefits of total knee arthroplasty were presented and reviewed. The risks  due to aseptic loosening, infection, stiffness, patella tracking problems, thromboembolic complications and other imponderables were discussed. The patient acknowledged the explanation, agreed to proceed with the plan and consent was signed. Patient is being admitted for inpatient treatment for surgery, pain control, PT, OT, prophylactic antibiotics, VTE prophylaxis, progressive ambulation and ADL's and discharge planning. The patient is planning to be discharged to skilled nursing facility [facility] in [facility]  Mariyana Fulop A. Gwinda Passe Physician Assistant [facility] [phone]  05/28/2013, 3:40 PM

## 2013-05-28 NOTE — Progress Notes (Signed)
Encounter reviewed by Dr. Brook Silva.  

## 2013-05-30 ENCOUNTER — Other Ambulatory Visit (HOSPITAL_COMMUNITY): Payer: Self-pay | Admitting: *Deleted

## 2013-05-30 ENCOUNTER — Encounter (HOSPITAL_COMMUNITY): Payer: Self-pay

## 2013-05-30 ENCOUNTER — Encounter (HOSPITAL_COMMUNITY)
Admission: RE | Admit: 2013-05-30 | Discharge: 2013-05-30 | Disposition: A | Discharge status: Home / Self Care | Payer: Medicare Other | Source: Ambulatory Visit | Attending: Orthopedic Surgery | Admitting: Orthopedic Surgery

## 2013-05-30 DIAGNOSIS — Z01812 Encounter for preprocedural laboratory examination: Secondary | ICD-10-CM | POA: Insufficient documentation

## 2013-05-30 DIAGNOSIS — Z01818 Encounter for other preprocedural examination: Secondary | ICD-10-CM | POA: Insufficient documentation

## 2013-05-30 LAB — CBC WITH DIFFERENTIAL/PLATELET
Basophils Absolute: 0.1 10*3/uL (ref 0.0–0.1)
Eosinophils Absolute: 0.3 10*3/uL (ref 0.0–0.7)
Eosinophils Relative: 4 % (ref 0–5)
HCT: 39.7 % (ref 36.0–46.0)
Hemoglobin: 13.9 g/dL (ref 12.0–15.0)
Lymphocytes Relative: 22 % (ref 12–46)
Lymphs Abs: 1.6 10*3/uL (ref 0.7–4.0)
MCHC: 35 g/dL (ref 30.0–36.0)
MCV: 84.6 fL (ref 78.0–100.0)
Monocytes Absolute: 0.5 10*3/uL (ref 0.1–1.0)
Monocytes Relative: 7 % (ref 3–12)
Neutro Abs: 4.8 10*3/uL (ref 1.7–7.7)
RBC: 4.69 MIL/uL (ref 3.87–5.11)
RDW: 15.2 % (ref 11.5–15.5)
WBC: 7.2 10*3/uL (ref 4.0–10.5)

## 2013-05-30 LAB — COMPREHENSIVE METABOLIC PANEL
ALT: 10 U/L (ref 0–35)
AST: 14 U/L (ref 0–37)
CO2: 26 mEq/L (ref 19–32)
Chloride: 107 mEq/L (ref 96–112)
Creatinine, Ser: 0.77 mg/dL (ref 0.50–1.10)
GFR calc Af Amer: >90 mL/min (ref >90)
GFR calc non Af Amer: 83 mL/min — ABNORMAL LOW (ref >90)
Glucose, Bld: 86 mg/dL (ref 70–99)
Sodium: 143 mEq/L (ref 135–145)
Total Bilirubin: 0.7 mg/dL (ref 0.3–1.2)

## 2013-05-30 LAB — SURGICAL PCR SCREEN
MRSA, PCR: NEGATIVE
Staphylococcus aureus: NEGATIVE

## 2013-05-30 LAB — URINALYSIS, ROUTINE W REFLEX MICROSCOPIC
Glucose, UA: NEGATIVE mg/dL
Hgb urine dipstick: NEGATIVE
Ketones, ur: NEGATIVE mg/dL
Leukocytes, UA: NEGATIVE
pH: 6.5 (ref 5.0–8.0)

## 2013-05-30 LAB — PROTIME-INR: INR: 1.1 (ref 0.00–1.49)

## 2013-05-30 LAB — APTT: aPTT: 32 seconds (ref 24–37)

## 2013-05-30 NOTE — Pre-Procedure Instructions (Addendum)
Jennifer Dyer  05/30/2013   Your procedure is scheduled on:  06/09/13  Report to [facility] cone short stay admitting at 530 AM.  Call this number if you have problems the morning of surgery: [phone]   Remember:   Do not eat food or drink liquids after midnight.   Take these medicines the morning of surgery with A SIP OF WATER: amlodiopine,celexa,          STOP all herbel meds, nsaids (aleve,naproxen,advil,ibuprofen) 5 days prior to surgery including  Aspirin, vitamins,fish oil    Do not wear jewelry, make-up or nail polish.  Do not wear lotions, powders, or perfumes. You may wear deodorant.  Do not shave 48 hours prior to surgery. Men may shave face and neck.  Do not bring valuables to the hospital.  [facility] is not responsible                  for any belongings or valuables.               Contacts, dentures or bridgework may not be worn into surgery.  Leave suitcase in the car. After surgery it may be brought to your room.  For patients admitted to the hospital, discharge time is determined by your                treatment team.               Patients discharged the day of surgery will not be allowed to drive  home.  Name and phone number of your driver:   Special Instructions: Incentive Spirometry - Practice and bring it with you on the day of surgery. Shower using CHG 2 nights before surgery and the night before surgery.  If you shower the day of surgery use CHG.  Use special wash - you have one bottle of CHG for all showers.  You should use approximately 1/3 of the bottle for each shower.   Please read over the following fact sheets that you were given: Pain Booklet, Coughing and Deep Breathing, Blood Transfusion Information, MRSA Information and Surgical Site Infection Prevention

## 2013-05-30 NOTE — Progress Notes (Signed)
Ted hose not available in size needed for calf .office    called and patient to bring hose ordered special ,from home.

## 2013-06-08 MED ORDER — TRANEXAMIC ACID 100 MG/ML IV SOLN
1000.0000 mg | INTRAVENOUS | Status: AC
  Administered 2013-06-09: 1000 mg via INTRAVENOUS
  Filled 2013-06-08: qty 10

## 2013-06-08 MED ORDER — DEXTROSE 5 % IV SOLN
3.0000 g | INTRAVENOUS | Status: AC
  Administered 2013-06-09: 3 g via INTRAVENOUS
  Filled 2013-06-08: qty 3000

## 2013-06-09 ENCOUNTER — Encounter (HOSPITAL_COMMUNITY): Payer: Medicare Other | Admitting: Anesthesiology

## 2013-06-09 ENCOUNTER — Inpatient Hospital Stay (HOSPITAL_COMMUNITY): Payer: Medicare Other | Admitting: Anesthesiology

## 2013-06-09 ENCOUNTER — Encounter (HOSPITAL_COMMUNITY)
Admission: RE | Disposition: A | Discharge status: Skilled Nursing Facility | Payer: Self-pay | Source: Ambulatory Visit | Attending: Orthopedic Surgery

## 2013-06-09 ENCOUNTER — Encounter (HOSPITAL_COMMUNITY): Payer: Self-pay | Admitting: *Deleted

## 2013-06-09 ENCOUNTER — Inpatient Hospital Stay (HOSPITAL_COMMUNITY)
Admission: RE | Admit: 2013-06-09 | Discharge: 2013-06-12 | DRG: 470 | Disposition: A | Discharge status: Skilled Nursing Facility | Payer: Medicare Other | Source: Ambulatory Visit | Attending: Orthopedic Surgery | Admitting: Orthopedic Surgery

## 2013-06-09 DIAGNOSIS — I10 Essential (primary) hypertension: Secondary | ICD-10-CM | POA: Diagnosis present

## 2013-06-09 DIAGNOSIS — M1710 Unilateral primary osteoarthritis, unspecified knee: Principal | ICD-10-CM | POA: Diagnosis present

## 2013-06-09 DIAGNOSIS — Z8249 Family history of ischemic heart disease and other diseases of the circulatory system: Secondary | ICD-10-CM

## 2013-06-09 DIAGNOSIS — Z823 Family history of stroke: Secondary | ICD-10-CM

## 2013-06-09 DIAGNOSIS — F3289 Other specified depressive episodes: Secondary | ICD-10-CM | POA: Diagnosis present

## 2013-06-09 DIAGNOSIS — Z7982 Long term (current) use of aspirin: Secondary | ICD-10-CM

## 2013-06-09 DIAGNOSIS — Z6841 Body Mass Index (BMI) 40.0 and over, adult: Secondary | ICD-10-CM

## 2013-06-09 DIAGNOSIS — N393 Stress incontinence (female) (male): Secondary | ICD-10-CM | POA: Diagnosis present

## 2013-06-09 DIAGNOSIS — M1712 Unilateral primary osteoarthritis, left knee: Secondary | ICD-10-CM | POA: Diagnosis present

## 2013-06-09 DIAGNOSIS — F329 Major depressive disorder, single episode, unspecified: Secondary | ICD-10-CM | POA: Diagnosis present

## 2013-06-09 DIAGNOSIS — Z79899 Other long term (current) drug therapy: Secondary | ICD-10-CM

## 2013-06-09 DIAGNOSIS — E669 Obesity, unspecified: Secondary | ICD-10-CM | POA: Diagnosis present

## 2013-06-09 DIAGNOSIS — F32A Depression, unspecified: Secondary | ICD-10-CM | POA: Diagnosis present

## 2013-06-09 DIAGNOSIS — M179 Osteoarthritis of knee, unspecified: Secondary | ICD-10-CM | POA: Diagnosis present

## 2013-06-09 HISTORY — PX: TOTAL KNEE ARTHROPLASTY: SHX125

## 2013-06-09 SURGERY — ARTHROPLASTY, KNEE, TOTAL
Anesthesia: General | Site: Knee | Laterality: Left

## 2013-06-09 MED ORDER — ROPIVACAINE HCL 5 MG/ML IJ SOLN
INTRAMUSCULAR | Status: DC | PRN
  Administered 2013-06-09: 30 mL via PERINEURAL

## 2013-06-09 MED ORDER — MIDAZOLAM HCL 5 MG/5ML IJ SOLN
INTRAMUSCULAR | Status: DC | PRN
  Administered 2013-06-09: 1 mg via INTRAVENOUS

## 2013-06-09 MED ORDER — VITAMIN D3 25 MCG (1000 UNIT) PO TABS
2000.0000 [IU] | ORAL_TABLET | Freq: Every day | ORAL | Status: DC
  Administered 2013-06-09 – 2013-06-12 (×4): 2000 [IU] via ORAL
  Filled 2013-06-09 (×4): qty 2

## 2013-06-09 MED ORDER — ONDANSETRON HCL 4 MG/2ML IJ SOLN: 4.0000 mg | Freq: Once | INTRAMUSCULAR | Status: DC | PRN

## 2013-06-09 MED ORDER — HYDROMORPHONE HCL PF 1 MG/ML IJ SOLN
0.2500 mg | INTRAMUSCULAR | Status: DC | PRN
  Administered 2013-06-09 (×5): 0.5 mg via INTRAVENOUS

## 2013-06-09 MED ORDER — ALUM & MAG HYDROXIDE-SIMETH 200-200-20 MG/5ML PO SUSP: 30.0000 mL | ORAL | Status: DC | PRN

## 2013-06-09 MED ORDER — POVIDONE-IODINE 7.5 % EX SOLN: Freq: Once | CUTANEOUS | Status: DC

## 2013-06-09 MED ORDER — LACTATED RINGERS IV SOLN
INTRAVENOUS | Status: DC | PRN
  Administered 2013-06-09 (×2): via INTRAVENOUS

## 2013-06-09 MED ORDER — ACETAMINOPHEN 325 MG PO TABS: 650.0000 mg | ORAL_TABLET | Freq: Four times a day (QID) | ORAL | Status: DC | PRN

## 2013-06-09 MED ORDER — LABETALOL HCL 5 MG/ML IV SOLN
INTRAVENOUS | Status: DC | PRN
  Administered 2013-06-09: 5 mg via INTRAVENOUS

## 2013-06-09 MED ORDER — POTASSIUM CHLORIDE IN NACL 20-0.9 MEQ/L-% IV SOLN
INTRAVENOUS | Status: DC
  Administered 2013-06-09: 100 mL/h via INTRAVENOUS
  Administered 2013-06-10: 06:00:00 via INTRAVENOUS
  Filled 2013-06-09 (×10): qty 1000

## 2013-06-09 MED ORDER — HYDROMORPHONE HCL PF 1 MG/ML IJ SOLN: 0.2500 mg | INTRAMUSCULAR | Status: DC | PRN

## 2013-06-09 MED ORDER — CITALOPRAM HYDROBROMIDE 20 MG PO TABS
20.0000 mg | ORAL_TABLET | Freq: Every day | ORAL | Status: DC
  Administered 2013-06-10 – 2013-06-12 (×3): 20 mg via ORAL
  Filled 2013-06-09 (×3): qty 1

## 2013-06-09 MED ORDER — HYDROMORPHONE HCL PF 1 MG/ML IJ SOLN
INTRAMUSCULAR | Status: AC
  Administered 2013-06-09: 11:00:00
  Filled 2013-06-09: qty 1

## 2013-06-09 MED ORDER — ONDANSETRON HCL 4 MG PO TABS: 4.0000 mg | ORAL_TABLET | Freq: Four times a day (QID) | ORAL | Status: DC | PRN

## 2013-06-09 MED ORDER — HYDROMORPHONE HCL PF 1 MG/ML IJ SOLN
INTRAMUSCULAR | Status: AC
  Administered 2013-06-09: 10:00:00
  Filled 2013-06-09: qty 1

## 2013-06-09 MED ORDER — MENTHOL 3 MG MT LOZG: 1.0000 | LOZENGE | OROMUCOSAL | Status: DC | PRN

## 2013-06-09 MED ORDER — LACTATED RINGERS IV SOLN: INTRAVENOUS | Status: DC

## 2013-06-09 MED ORDER — DIPHENHYDRAMINE HCL 12.5 MG/5ML PO ELIX
12.5000 mg | ORAL_SOLUTION | ORAL | Status: DC | PRN
  Administered 2013-06-10: 25 mg via ORAL
  Filled 2013-06-09: qty 10

## 2013-06-09 MED ORDER — METOCLOPRAMIDE HCL 5 MG/ML IJ SOLN: 5.0000 mg | Freq: Three times a day (TID) | INTRAMUSCULAR | Status: DC | PRN

## 2013-06-09 MED ORDER — CLOBETASOL PROPIONATE 0.05 % EX OINT
1.0000 "application " | TOPICAL_OINTMENT | Freq: Two times a day (BID) | CUTANEOUS | Status: DC
  Administered 2013-06-10 – 2013-06-11 (×2): 1 via TOPICAL
  Filled 2013-06-09: qty 15

## 2013-06-09 MED ORDER — CHLORHEXIDINE GLUCONATE 4 % EX LIQD: 60.0000 mL | Freq: Once | CUTANEOUS | Status: DC

## 2013-06-09 MED ORDER — ROCURONIUM BROMIDE 100 MG/10ML IV SOLN
INTRAVENOUS | Status: DC | PRN
  Administered 2013-06-09: 50 mg via INTRAVENOUS

## 2013-06-09 MED ORDER — LOSARTAN POTASSIUM 50 MG PO TABS
50.0000 mg | ORAL_TABLET | Freq: Every day | ORAL | Status: DC
Start: 2013-06-11 — End: 2013-06-10

## 2013-06-09 MED ORDER — GLYCOPYRROLATE 0.2 MG/ML IJ SOLN
INTRAMUSCULAR | Status: DC | PRN
  Administered 2013-06-09: 0.6 mg via INTRAVENOUS

## 2013-06-09 MED ORDER — ONE-DAILY MULTI VITAMINS PO TABS: 1.0000 | ORAL_TABLET | Freq: Every day | ORAL | Status: DC

## 2013-06-09 MED ORDER — BUPIVACAINE-EPINEPHRINE (PF) 0.25% -1:200000 IJ SOLN
INTRAMUSCULAR | Status: AC
  Filled 2013-06-09: qty 30

## 2013-06-09 MED ORDER — FENTANYL CITRATE 0.05 MG/ML IJ SOLN
INTRAMUSCULAR | Status: DC | PRN
  Administered 2013-06-09: 100 ug via INTRAVENOUS
  Administered 2013-06-09 (×2): 50 ug via INTRAVENOUS

## 2013-06-09 MED ORDER — NEOSTIGMINE METHYLSULFATE 1 MG/ML IJ SOLN
INTRAMUSCULAR | Status: DC | PRN
  Administered 2013-06-09: 4 mg via INTRAVENOUS

## 2013-06-09 MED ORDER — BISACODYL 5 MG PO TBEC
10.0000 mg | DELAYED_RELEASE_TABLET | Freq: Every day | ORAL | Status: DC
  Administered 2013-06-09 – 2013-06-11 (×3): 10 mg via ORAL
  Filled 2013-06-09 (×3): qty 2

## 2013-06-09 MED ORDER — HYDROMORPHONE HCL PF 1 MG/ML IJ SOLN
1.0000 mg | INTRAMUSCULAR | Status: DC | PRN
  Administered 2013-06-09 – 2013-06-10 (×4): 1 mg via INTRAVENOUS
  Filled 2013-06-09 (×4): qty 1

## 2013-06-09 MED ORDER — DEXTROSE 5 % IV SOLN
INTRAVENOUS | Status: DC | PRN
  Administered 2013-06-09: 08:00:00 via INTRAVENOUS

## 2013-06-09 MED ORDER — ADULT MULTIVITAMIN W/MINERALS CH
1.0000 | ORAL_TABLET | Freq: Every day | ORAL | Status: DC
  Administered 2013-06-09 – 2013-06-12 (×4): 1 via ORAL
  Filled 2013-06-09 (×4): qty 1

## 2013-06-09 MED ORDER — DEXAMETHASONE 6 MG PO TABS
10.0000 mg | ORAL_TABLET | Freq: Three times a day (TID) | ORAL | Status: AC
  Administered 2013-06-09: 10 mg via ORAL
  Filled 2013-06-09 (×3): qty 1

## 2013-06-09 MED ORDER — CEFAZOLIN SODIUM-DEXTROSE 2-3 GM-% IV SOLR
2.0000 g | Freq: Four times a day (QID) | INTRAVENOUS | Status: AC
  Administered 2013-06-09 (×2): 2 g via INTRAVENOUS
  Filled 2013-06-09 (×2): qty 50

## 2013-06-09 MED ORDER — DEXAMETHASONE SODIUM PHOSPHATE 10 MG/ML IJ SOLN
10.0000 mg | Freq: Three times a day (TID) | INTRAMUSCULAR | Status: AC
  Administered 2013-06-09 – 2013-06-10 (×2): 10 mg via INTRAVENOUS
  Filled 2013-06-09 (×3): qty 1

## 2013-06-09 MED ORDER — AMLODIPINE BESYLATE 5 MG PO TABS
5.0000 mg | ORAL_TABLET | Freq: Every day | ORAL | Status: DC
  Administered 2013-06-10 – 2013-06-12 (×3): 5 mg via ORAL
  Filled 2013-06-09 (×3): qty 1

## 2013-06-09 MED ORDER — BUPIVACAINE-EPINEPHRINE 0.25% -1:200000 IJ SOLN
INTRAMUSCULAR | Status: DC | PRN
  Administered 2013-06-09: 30 mL

## 2013-06-09 MED ORDER — MIRABEGRON ER 50 MG PO TB24
50.0000 mg | ORAL_TABLET | Freq: Every day | ORAL | Status: DC
  Administered 2013-06-09 – 2013-06-12 (×4): 50 mg via ORAL
  Filled 2013-06-09 (×4): qty 1

## 2013-06-09 MED ORDER — CELECOXIB 200 MG PO CAPS
200.0000 mg | ORAL_CAPSULE | Freq: Two times a day (BID) | ORAL | Status: DC
  Administered 2013-06-09 (×2): 200 mg via ORAL
  Filled 2013-06-09 (×4): qty 1

## 2013-06-09 MED ORDER — ONDANSETRON HCL 4 MG/2ML IJ SOLN
INTRAMUSCULAR | Status: DC | PRN
  Administered 2013-06-09: 4 mg via INTRAVENOUS

## 2013-06-09 MED ORDER — OXYCODONE HCL 5 MG PO TABS
5.0000 mg | ORAL_TABLET | ORAL | Status: DC | PRN
  Administered 2013-06-09 – 2013-06-12 (×11): 10 mg via ORAL
  Filled 2013-06-09 (×11): qty 2

## 2013-06-09 MED ORDER — ACETAMINOPHEN 650 MG RE SUPP: 650.0000 mg | Freq: Four times a day (QID) | RECTAL | Status: DC | PRN

## 2013-06-09 MED ORDER — ONDANSETRON HCL 4 MG/2ML IJ SOLN: 4.0000 mg | Freq: Four times a day (QID) | INTRAMUSCULAR | Status: DC | PRN

## 2013-06-09 MED ORDER — HYDROMORPHONE HCL PF 1 MG/ML IJ SOLN
INTRAMUSCULAR | Status: AC
  Administered 2013-06-09: 0.5 mg
  Filled 2013-06-09: qty 1

## 2013-06-09 MED ORDER — ZOLPIDEM TARTRATE 5 MG PO TABS
5.0000 mg | ORAL_TABLET | Freq: Every evening | ORAL | Status: DC | PRN
  Administered 2013-06-10: 5 mg via ORAL
  Filled 2013-06-09: qty 1

## 2013-06-09 MED ORDER — PHENOL 1.4 % MT LIQD: 1.0000 | OROMUCOSAL | Status: DC | PRN

## 2013-06-09 MED ORDER — SODIUM CHLORIDE 0.9 % IR SOLN
Status: DC | PRN
  Administered 2013-06-09: 3000 mL

## 2013-06-09 MED ORDER — CALCIUM CARBONATE 1250 (500 CA) MG PO CAPS: 1250.0000 mg | ORAL_CAPSULE | Freq: Two times a day (BID) | ORAL | Status: DC

## 2013-06-09 MED ORDER — LIDOCAINE HCL (CARDIAC) 20 MG/ML IV SOLN
INTRAVENOUS | Status: DC | PRN
  Administered 2013-06-09: 80 mg via INTRAVENOUS

## 2013-06-09 MED ORDER — DOCUSATE SODIUM 100 MG PO CAPS
100.0000 mg | ORAL_CAPSULE | Freq: Two times a day (BID) | ORAL | Status: DC
  Administered 2013-06-09 – 2013-06-12 (×7): 100 mg via ORAL
  Filled 2013-06-09 (×8): qty 1

## 2013-06-09 MED ORDER — METOCLOPRAMIDE HCL 10 MG PO TABS: 5.0000 mg | ORAL_TABLET | Freq: Three times a day (TID) | ORAL | Status: DC | PRN

## 2013-06-09 MED ORDER — ASPIRIN EC 325 MG PO TBEC
325.0000 mg | DELAYED_RELEASE_TABLET | Freq: Every day | ORAL | Status: DC
  Administered 2013-06-10 – 2013-06-12 (×3): 325 mg via ORAL
  Filled 2013-06-09 (×4): qty 1

## 2013-06-09 MED ORDER — ARTIFICIAL TEARS OP OINT
TOPICAL_OINTMENT | OPHTHALMIC | Status: DC | PRN
  Administered 2013-06-09: 1 via OPHTHALMIC

## 2013-06-09 MED ORDER — CALCIUM CARBONATE 1250 (500 CA) MG PO TABS
1.0000 | ORAL_TABLET | Freq: Two times a day (BID) | ORAL | Status: DC
  Administered 2013-06-09 – 2013-06-12 (×7): 500 mg via ORAL
  Filled 2013-06-09 (×8): qty 1

## 2013-06-09 MED ORDER — PROPOFOL 10 MG/ML IV BOLUS
INTRAVENOUS | Status: DC | PRN
  Administered 2013-06-09: 40 mg via INTRAVENOUS
  Administered 2013-06-09: 160 mg via INTRAVENOUS

## 2013-06-09 MED ORDER — SILVER SULFADIAZINE 1 % EX CREA
TOPICAL_CREAM | CUTANEOUS | Status: DC
  Filled 2013-06-09: qty 85

## 2013-06-09 MED ORDER — OXYCODONE HCL 5 MG PO TABS
ORAL_TABLET | ORAL | Status: AC
  Administered 2013-06-09: 11:00:00
  Filled 2013-06-09: qty 2

## 2013-06-09 SURGICAL SUPPLY — 66 items
BANDAGE ESMARK 6X9 LF (GAUZE/BANDAGES/DRESSINGS) ×1 IMPLANT
BLADE SAGITTAL 25.0X1.19X90 (BLADE) ×2 IMPLANT
BLADE SAW SGTL 11.0X1.19X90.0M (BLADE) IMPLANT
BLADE SAW SGTL 13.0X1.19X90.0M (BLADE) ×2 IMPLANT
BLADE SURG 10 STRL SS (BLADE) ×4 IMPLANT
BNDG CMPR 9X6 STRL LF SNTH (GAUZE/BANDAGES/DRESSINGS) ×1
BNDG CMPR MED 15X6 ELC VLCR LF (GAUZE/BANDAGES/DRESSINGS) ×1
BNDG ELASTIC 6X15 VLCR STRL LF (GAUZE/BANDAGES/DRESSINGS) ×2 IMPLANT
BNDG ESMARK 6X9 LF (GAUZE/BANDAGES/DRESSINGS) ×2
BOWL SMART MIX CTS (DISPOSABLE) ×2 IMPLANT
CAPT RP KNEE ×1 IMPLANT
CEMENT HV SMART SET (Cement) ×4 IMPLANT
CLOTH BEACON ORANGE TIMEOUT ST (SAFETY) ×2 IMPLANT
COVER SURGICAL LIGHT HANDLE (MISCELLANEOUS) ×2 IMPLANT
CUFF TOURNIQUET SINGLE 34IN LL (TOURNIQUET CUFF) ×2 IMPLANT
CUFF TOURNIQUET SINGLE 44IN (TOURNIQUET CUFF) IMPLANT
DRAPE EXTREMITY T 121X128X90 (DRAPE) ×2 IMPLANT
DRAPE INCISE IOBAN 66X45 STRL (DRAPES) ×1 IMPLANT
DRAPE PROXIMA HALF (DRAPES) ×2 IMPLANT
DRAPE U-SHAPE 47X51 STRL (DRAPES) ×2 IMPLANT
DRSG ADAPTIC 3X8 NADH LF (GAUZE/BANDAGES/DRESSINGS) ×2 IMPLANT
DRSG MEPILEX BORDER 4X8 (GAUZE/BANDAGES/DRESSINGS) ×3 IMPLANT
DRSG PAD ABDOMINAL 8X10 ST (GAUZE/BANDAGES/DRESSINGS) ×4 IMPLANT
DURAPREP 26ML APPLICATOR (WOUND CARE) ×2 IMPLANT
ELECT CAUTERY BLADE 6.4 (BLADE) ×2 IMPLANT
ELECT REM PT RETURN 9FT ADLT (ELECTROSURGICAL) ×2
ELECTRODE REM PT RTRN 9FT ADLT (ELECTROSURGICAL) ×1 IMPLANT
EVACUATOR 1/8 PVC DRAIN (DRAIN) ×2 IMPLANT
FACESHIELD LNG OPTICON STERILE (SAFETY) ×2 IMPLANT
GLOVE BIO SURGEON STRL SZ7 (GLOVE) ×2 IMPLANT
GLOVE BIOGEL PI IND STRL 7.0 (GLOVE) ×1 IMPLANT
GLOVE BIOGEL PI IND STRL 7.5 (GLOVE) ×1 IMPLANT
GLOVE BIOGEL PI INDICATOR 7.0 (GLOVE) ×1
GLOVE BIOGEL PI INDICATOR 7.5 (GLOVE) ×1
GLOVE SS BIOGEL STRL SZ 7.5 (GLOVE) ×1 IMPLANT
GLOVE SUPERSENSE BIOGEL SZ 7.5 (GLOVE) ×1
GOWN PREVENTION PLUS XLARGE (GOWN DISPOSABLE) ×4 IMPLANT
GOWN STRL NON-REIN LRG LVL3 (GOWN DISPOSABLE) ×4 IMPLANT
HANDPIECE INTERPULSE COAX TIP (DISPOSABLE) ×2
HOOD PEEL AWAY FACE SHEILD DIS (HOOD) ×4 IMPLANT
IMMOBILIZER KNEE 22 UNIV (SOFTGOODS) ×1 IMPLANT
KIT BASIN OR (CUSTOM PROCEDURE TRAY) ×2 IMPLANT
KIT ROOM TURNOVER OR (KITS) ×2 IMPLANT
MANIFOLD NEPTUNE II (INSTRUMENTS) ×2 IMPLANT
NS IRRIG 1000ML POUR BTL (IV SOLUTION) ×2 IMPLANT
PACK TOTAL JOINT (CUSTOM PROCEDURE TRAY) ×2 IMPLANT
PAD ARMBOARD 7.5X6 YLW CONV (MISCELLANEOUS) ×4 IMPLANT
PAD CAST 4YDX4 CTTN HI CHSV (CAST SUPPLIES) ×1 IMPLANT
PADDING CAST COTTON 4X4 STRL (CAST SUPPLIES) ×2
PADDING CAST COTTON 6X4 STRL (CAST SUPPLIES) ×2 IMPLANT
RUBBERBAND STERILE (MISCELLANEOUS) ×2 IMPLANT
SET HNDPC FAN SPRY TIP SCT (DISPOSABLE) ×1 IMPLANT
SPONGE GAUZE 4X4 12PLY (GAUZE/BANDAGES/DRESSINGS) ×2 IMPLANT
STRIP CLOSURE SKIN 1/2X4 (GAUZE/BANDAGES/DRESSINGS) ×2 IMPLANT
SUCTION FRAZIER TIP 10 FR DISP (SUCTIONS) ×2 IMPLANT
SUT ETHIBOND NAB CT1 #1 30IN (SUTURE) ×3 IMPLANT
SUT MNCRL AB 3-0 PS2 18 (SUTURE) ×2 IMPLANT
SUT VIC AB 0 CT1 27 (SUTURE) ×4
SUT VIC AB 0 CT1 27XBRD ANBCTR (SUTURE) ×2 IMPLANT
SUT VIC AB 2-0 CT1 27 (SUTURE) ×4
SUT VIC AB 2-0 CT1 TAPERPNT 27 (SUTURE) ×2 IMPLANT
SYR 30ML SLIP (SYRINGE) ×2 IMPLANT
TOWEL OR 17X24 6PK STRL BLUE (TOWEL DISPOSABLE) ×2 IMPLANT
TOWEL OR 17X26 10 PK STRL BLUE (TOWEL DISPOSABLE) ×2 IMPLANT
TRAY FOLEY CATH 16FR SILVER (SET/KITS/TRAYS/PACK) ×2 IMPLANT
WATER STERILE IRR 1000ML POUR (IV SOLUTION) ×4 IMPLANT

## 2013-06-09 NOTE — Care Management Note (Signed)
CARE MANAGEMENT NOTE 06/09/2013  Patient:  Jennifer Dyer, Jennifer Dyer   Account Number:  [id number]  Date Initiated:  06/09/2013  Documentation initiated by:  Vance Peper  Subjective/Objective Assessment:   72 yr old female s/p left total knee arthroplasty.     Action/Plan:   Patient will require shortterm rehab at [facility]. Patient wants to go to [facility]  Social worker notified.   Anticipated DC Date:  06/10/2013   Anticipated DC Plan:  SKILLED NURSING FACILITY         Choice offered to / List presented to:             Status of service:  Completed, signed off   Discharge Disposition:  SKILLED NURSING FACILITY

## 2013-06-09 NOTE — Anesthesia Preprocedure Evaluation (Addendum)
Anesthesia Evaluation  Patient identified by MRN, date of birth, ID band Patient awake    Reviewed: Allergy & Precautions, H&P , NPO status , Patient's Chart, lab work & pertinent test results, reviewed documented beta blocker date and time   Airway  TM Distance: >3 FB Neck ROM: Full    Dental  (+) Teeth Intact and Dental Advisory Given   Pulmonary          Cardiovascular hypertension, Pt. on medications     Neuro/Psych    GI/Hepatic   Endo/Other    Renal/GU      Musculoskeletal   Abdominal   Peds  Hematology   Anesthesia Other Findings   Reproductive/Obstetrics                          Anesthesia Physical Anesthesia Plan  ASA: II  Anesthesia Plan: General   Post-op Pain Management:    Induction: Intravenous  Airway Management Planned: Oral ETT and LMA  Additional Equipment:   Intra-op Plan:   Post-operative Plan: Extubation in OR  Informed Consent: I have reviewed the patients History and Physical, chart, labs and discussed the procedure including the risks, benefits and alternatives for the proposed anesthesia with the patient or authorized representative who has indicated his/her understanding and acceptance.     Plan Discussed with:   Anesthesia Plan Comments:         Anesthesia Quick Evaluation

## 2013-06-09 NOTE — Interval H&P Note (Signed)
History and Physical Interval Note:  06/09/2013 7:06 AM  Jennifer Dyer  has presented today for surgery, with the diagnosis of DJD LEFT KNEE  The various methods of treatment have been discussed with the patient and family. After consideration of risks, benefits and other options for treatment, the patient has consented to  Procedure(s): TOTAL KNEE ARTHROPLASTY (Left) as a surgical intervention .  The patient's history has been reviewed, patient examined, no change in status, stable for surgery.  I have reviewed the patient's chart and labs.  Questions were answered to the patient's satisfaction.     Salvatore Marvel A

## 2013-06-09 NOTE — Transfer of Care (Signed)
Immediate Anesthesia Transfer of Care Note  Patient: Jennifer Dyer  Procedure(s) Performed: Procedure(s): TOTAL KNEE ARTHROPLASTY (Left)  Patient Location: PACU  Anesthesia Type:General  Level of Consciousness: awake, oriented and patient cooperative  Airway & Oxygen Therapy: Patient Spontanous Breathing and Patient connected to nasal cannula oxygen  Post-op Assessment: Report given to PACU RN and Post -op Vital signs reviewed and stable  Post vital signs: Reviewed and stable  Complications: No apparent anesthesia complications

## 2013-06-09 NOTE — Op Note (Signed)
MRN:     [id number] DOB/AGE:    1941-01-08 / 72 y.o.       OPERATIVE REPORT    DATE OF PROCEDURE:  06/09/2013       PREOPERATIVE DIAGNOSIS:   DJD LEFT KNEE      Estimated body mass index is 42.79 kg/(m^2) as calculated from the following:   Height as of 05/30/13: 5\' 2"  (1.575 m).   Weight as of 05/02/13: 106.142 kg (234 lb).                                                        POSTOPERATIVE DIAGNOSIS:   degenerative joint disease left knee                                                                      PROCEDURE:  Procedure(s): TOTAL KNEE ARTHROPLASTY Using Depuy Sigma RP implants #3 Femur, #4Tibia, 10mm sigma RP bearing, 32 Patella     SURGEON: Baelyn Doring A    ASSISTANT:  Kirstin Shepperson PA-C   (Present and scrubbed throughout the case, critical for assistance with exposure, retraction, instrumentation, and closure.)         ANESTHESIA: GET with Femoral Nerve Block  DRAINS: foley, 2 medium hemovac in knee   TOURNIQUET TIME:   COMPLICATIONS:  None     SPECIMENS: None   INDICATIONS FOR PROCEDURE: The patient has  DJD LEFT KNEE, varus deformities, XR shows bone on bone arthritis. Patient has failed all conservative measures including anti-inflammatory medicines, narcotics, attempts at  exercise and weight loss, cortisone injections and viscosupplementation.  Risks and benefits of surgery have been discussed, questions answered.   DESCRIPTION OF PROCEDURE: The patient identified by armband, received  right femoral nerve block and IV antibiotics, in the holding area at [facility]. Patient taken to the operating room, appropriate anesthetic  monitors were attached General endotracheal anesthesia induced with  the patient in supine position, Foley catheter was inserted. Tourniquet  applied high to the operative thigh. Lateral post and foot positioner  applied to the table, the lower extremity was then prepped and draped  in usual sterile fashion from the ankle  to the tourniquet. Time-out procedure was performed. The limb was wrapped with an Esmarch bandage and the tourniquet inflated to 365 mmHg. We began the operation by making the anterior midline incision starting at handbreadth above the patella going over the patella 1 cm medial to and  4 cm distal to the tibial tubercle. Small bleeders in the skin and the  subcutaneous tissue identified and cauterized. Transverse retinaculum was incised and reflected medially and a medial parapatellar arthrotomy was accomplished. the patella was everted and theprepatellar fat pad resected. The superficial medial collateral  ligament was then elevated from anterior to posterior along the proximal  flare of the tibia and anterior half of the menisci resected. The knee was hyperflexed exposing bone on bone arthritis. Peripheral and notch osteophytes as well as the cruciate ligaments were then resected. We continued to  work our way around posteriorly along the proximal tibia, and externally  rotated the tibia subluxing it out from underneath the femur. A McHale  retractor was placed through the notch and a lateral Hohmann retractor  placed, and we then drilled through the proximal tibia in line with the  axis of the tibia followed by an intramedullary guide rod and 2-degree  posterior slope cutting guide. The tibial cutting guide was pinned into place  allowing resection of 6 mm of bone medially and about 4 mm of bone  laterally because of her valgus deformity. Satisfied with the tibial resection, we then  entered the distal femur 2 mm anterior to the PCL origin with the  intramedullary guide rod and applied the distal femoral cutting guide  set at 11mm, with 5 degrees of valgus. This was pinned along the  epicondylar axis. At this point, the distal femoral cut was accomplished without difficulty. We then sized for a #3 femoral component and pinned the guide in 3 degrees of external rotation.The chamfer cutting guide  was pinned into place. The anterior, posterior, and chamfer cuts were accomplished without difficulty followed by  the Sigma RP box cutting guide and the box cut. We also removed posterior osteophytes from the posterior femoral condyles. At this  time, the knee was brought into full extension. We checked our  extension and flexion gaps and found them symmetric at 10mm.  The patella thickness measured at 21 mm. We set the cutting guide at 13 and removed the posterior 8 mm  of the patella sized for 32 button and drilled the lollipop. The knee  was then once again hyperflexed exposing the proximal tibia. We sized for a #4 tibial base plate, applied the smokestack and the conical reamer followed by the the Delta fin keel punch. We then hammered into place the Sigma RP trial femoral component, inserted a 10-mm trial bearing, trial patellar button, and took the knee through range of motion from 0-130 degrees. No thumb pressure was required for patellar  tracking. At this point, all trial components were removed, a double batch of DePuy HV cement with was mixed and applied to all bony metallic mating surfaces except for the posterior condyles of the femur itself. In order, we  hammered into place the tibial tray and removed excess cement, the femoral component and removed excess cement, a 10-mm Sigma RP bearing  was inserted, and the knee brought to full extension with compression.  The patellar button was clamped into place, and excess cement  removed. While the cement cured the wound was irrigated out with normal saline solution pulse lavage, and medium Hemovac drains were placed.. Ligament stability and patellar tracking were checked and found to be excellent. The tourniquet was then released and hemostasis was obtained with cautery. The parapatellar arthrotomy was closed with  #1 ethibond suture. The subcutaneous tissue with 0 and 2-0 undyed  Vicryl suture, and 4-0 Monocryl.. A dressing of Xeroform,  4 x  4, dressing sponges, Webril, and Ace wrap applied. Needle and sponge count were correct times 2.The patient awakened, extubated, and taken to recovery room without difficulty. Vascular status was normal, pulses 2+ and symmetric.   Jennifer Dyer A 06/09/2013, 8:44 AM

## 2013-06-09 NOTE — Plan of Care (Signed)
Problem: Consults Goal: Diagnosis- Total Joint Replacement Outcome: Completed/Met Date Met:  06/09/13 Revision Total Knee

## 2013-06-09 NOTE — Progress Notes (Signed)
Utilization review completed.  

## 2013-06-09 NOTE — Progress Notes (Signed)
Orthopedic Tech Progress Note Patient Details:  Jennifer Dyer 1940-09-02 [id number]  Patient ID: Jennifer Dyer, female   DOB: 1941/03/13, 72 y.o.   MRN: [id number] Placed pt's lle in cpm @ 0-60 degrees @ [address] Jennifer Dyer 06/09/2013, 8:10 PM

## 2013-06-09 NOTE — Progress Notes (Signed)
Orthopedic Tech Progress Note Patient Details:  Jennifer Dyer [birth year] [id number] CPM applied to Left LE with appropriate settings. OHF applied to bed. Footsie roll provided.  CPM Left Knee CPM Left Knee: On Left Knee Flexion (Degrees): 60 Left Knee Extension (Degrees): 0   Asia R Thompson 06/09/2013, 10:20 AM

## 2013-06-09 NOTE — Preoperative (Signed)
Beta Blockers   Reason not to administer Beta Blockers:Not Applicable 

## 2013-06-09 NOTE — Anesthesia Procedure Notes (Addendum)
Anesthesia Regional Block:  Adductor canal block  Pre-Anesthetic Checklist: ,, timeout performed, Correct Patient, Correct Site, Correct Laterality, Correct Procedure, Correct Position, site marked, Risks and benefits discussed, pre-op evaluation, post-op pain management  Laterality: Left  Prep: chloraprep       Needles:  Injection technique: Single-shot  Needle Type: Echogenic Stimulator Needle      Needle Gauge: 21 and 21 G    Additional Needles:  Procedures: ultrasound guided (picture in chart) Adductor canal block Narrative:  Start time: 06/09/2013 7:00 AM End time: 06/09/2013 7:10 AM Injection made incrementally with aspirations every 5 mL.  Performed by: Personally  Anesthesiologist: Fitzgerald,MD   Procedure Name: Intubation Date/Time: 06/09/2013 7:24 AM Performed by: Darcey Nora B Pre-anesthesia Checklist: Patient identified, Emergency Drugs available, Suction available and Patient being monitored Patient Re-evaluated:Patient Re-evaluated prior to inductionOxygen Delivery Method: Circle system utilized Preoxygenation: Pre-oxygenation with 100% oxygen Intubation Type: IV induction Ventilation: Mask ventilation without difficulty Laryngoscope Size: Mac and 4 Grade View: Grade II Tube type: Oral Tube size: 7.5 mm Number of attempts: 1 Placement Confirmation: ETT inserted through vocal cords under direct vision,  breath sounds checked- equal and bilateral and positive ETCO2 Secured at: 21 (cm at c heeks) cm Tube secured with: Tape Dental Injury: Teeth and Oropharynx as per pre-operative assessment

## 2013-06-09 NOTE — Anesthesia Postprocedure Evaluation (Signed)
  Anesthesia Post-op Note  Patient: Jennifer Dyer  Procedure(s) Performed: Procedure(s): TOTAL KNEE ARTHROPLASTY (Left)  Patient Location: PACU  Anesthesia Type:General and GA combined with regional for post-op pain  Level of Consciousness: awake, oriented, sedated and patient cooperative  Airway and Oxygen Therapy: Patient Spontanous Breathing  Post-op Pain: moderate  Post-op Assessment: Post-op Vital signs reviewed, Patient's Cardiovascular Status Stable, Respiratory Function Stable, Patent Airway, No signs of Nausea or vomiting and Pain level controlled  Post-op Vital Signs: stable  Complications: No apparent anesthesia complications

## 2013-06-09 NOTE — H&P (View-Only) (Signed)
TOTAL KNEE ADMISSION H&P  Patient is being admitted for left total knee arthroplasty.  Subjective:  Chief Complaint:left knee pain.  HPI: Jennifer Dyer, 72 y.o. female, has a history of pain and functional disability in the left knee due to arthritis and has failed non-surgical conservative treatments for greater than 12 weeks to includeNSAID's and/or analgesics, corticosteriod injections, viscosupplementation injections, flexibility and strengthening excercises, supervised PT with diminished ADL's post treatment, use of assistive devices and activity modification.  Onset of symptoms was gradual, starting >10 years ago with gradually worsening course since that time. The patient noted no past surgery on the left knee(s).  Patient currently rates pain in the left knee(s) at 10 out of 10 with activity. Patient has night pain, worsening of pain with activity and weight bearing, pain that interferes with activities of daily living, crepitus and joint swelling.  Patient has evidence of subchondral sclerosis, periarticular osteophytes and joint space narrowing by imaging studies. . There is no active infection.  Patient Active Problem List   Diagnosis Date Noted  . Abnormal uterine bleeding (AUB)   . Left knee DJD   . Depression   . HTN (hypertension)   . Obesity   . SUI (stress urinary incontinence, female)    Past Medical History  Diagnosis Date  . HTN (hypertension)   . Obesity   . Abnormal uterine bleeding (AUB)   . SUI (stress urinary incontinence, female)   . Left knee DJD   . Depression     Past Surgical History  Procedure Laterality Date  . Appendectomy    . Knee arthroscopy Left 12/2011  . Tubal ligation Bilateral   . Lumbar disc surgery  12/94  . Ankle fracture surgery Left 04    fusion     (Not in a hospital admission) No Known Allergies   Meds ordered this encounter  Medications  . mirabegron ER (MYRBETRIQ) 50 MG TB24 tablet    Sig: Take 50 mg by mouth daily.    Current Outpatient Prescriptions on File Prior to Visit  Medication Sig Dispense Refill  . amLODipine (NORVASC) 5 MG tablet Take 5 mg by mouth daily.      . aspirin EC 81 MG tablet Take 81 mg by mouth daily.      . calcium carbonate 1250 MG capsule Take 1,200 mg by mouth 2 (two) times daily with a meal.      . Cholecalciferol (VITAMIN D) 2000 UNITS CAPS Take 2,000 Units by mouth daily.       . citalopram (CELEXA) 20 MG tablet Take 20 mg by mouth daily.       . losartan-hydrochlorothiazide (HYZAAR) 50-12.5 MG per tablet Take 1 tablet by mouth daily.       . Multiple Vitamin (MULTIVITAMIN) tablet Take 1 tablet by mouth daily.      . clobetasol ointment (TEMOVATE) 0.05 % Apply 1 application topically 2 (two) times daily.  60 g  1  . fish oil-omega-3 fatty acids 1000 MG capsule Take 2,400 mg by mouth 2 (two) times daily.       No current facility-administered medications on file prior to visit.    History  Substance Use Topics  . Smoking status: Never Smoker   . Smokeless tobacco: Never Used  . Alcohol Use: No    Family History  Problem Relation Age of Onset  . Heart disease Mother   . Heart disease Father   . Heart attack Mother   . Heart attack Father   .   Stroke Mother   . Hypertension    . Clotting disorder Sister     sister died of a PE post op     Review of Systems  Constitutional: Negative.   HENT: Negative.   Eyes: Negative.   Respiratory: Negative.   Gastrointestinal: Negative.   Genitourinary: Negative.   Musculoskeletal: Positive for joint pain.  Skin: Negative.   Neurological: Negative.   Endo/Heme/Allergies: Negative.   Psychiatric/Behavioral: Negative.     Objective:  Physical Exam  Constitutional: She is oriented to person, place, and time. She appears well-developed and well-nourished.  HENT:  Head: Normocephalic and atraumatic.  Mouth/Throat: Oropharynx is clear and moist.  Eyes: Conjunctivae are normal. Pupils are equal, round, and reactive to  light.  Neck: Normal range of motion. Neck supple.  Cardiovascular: Normal rate and regular rhythm.   Respiratory: Effort normal and breath sounds normal.  GI: Soft. Bowel sounds are normal.  Genitourinary:  Not pertinent to current symptomatology therefore not examined.  Musculoskeletal:  Examination of her left knee reveals pain medially and laterally with valgus deformity 1+ synovitis 1+crepitation range of motion is -5-120 degrees with pain knee is stable with normal patella tracking. Exam of the right knee reveals full range of motion with mild pain No swelling weakness or instability. Vascular exam: pulses 2+ and symmetric.  Neurological: She is alert and oriented to person, place, and time.  Skin: Skin is warm and dry.  Psychiatric: She has a normal mood and affect. Her behavior is normal. Thought content normal.    Vital signs in last 24 hours: Last recorded: 12/03 1500   BP: 145/85 Pulse: 98  Temp: 97.7 F (36.5 C)    Height: 5' 3" (1.6 m) SpO2: 95  Weight: 107.956 kg (238 lb)     Labs:   Estimated body mass index is 42.17 kg/(m^2) as calculated from the following:   Height as of this encounter: 5' 3" (1.6 m).   Weight as of this encounter: 107.956 kg (238 lb).   Imaging Review Plain radiographs demonstrate severe degenerative joint disease of the left knee(s). The overall alignment issignificant valgus. The bone quality appears to be good for age and reported activity level.  Assessment/Plan:  End stage arthritis, left knee   The patient history, physical examination, clinical judgment of the provider and imaging studies are consistent with end stage degenerative joint disease of the left knee(s) and total knee arthroplasty is deemed medically necessary. The treatment options including medical management, injection therapy arthroscopy and arthroplasty were discussed at length. The risks and benefits of total knee arthroplasty were presented and reviewed. The risks  due to aseptic loosening, infection, stiffness, patella tracking problems, thromboembolic complications and other imponderables were discussed. The patient acknowledged the explanation, agreed to proceed with the plan and consent was signed. Patient is being admitted for inpatient treatment for surgery, pain control, PT, OT, prophylactic antibiotics, VTE prophylaxis, progressive ambulation and ADL's and discharge planning. The patient is planning to be discharged to skilled nursing facility [facility] in [place]  Julus Kelley A. Karma Ansley, PA-C Physician Assistant [facility] [phone]  05/28/2013, 3:40 PM   

## 2013-06-10 ENCOUNTER — Encounter (HOSPITAL_COMMUNITY): Payer: Self-pay | Admitting: General Practice

## 2013-06-10 LAB — CBC
HCT: 35.3 % — ABNORMAL LOW (ref 36.0–46.0)
MCHC: 34 g/dL (ref 30.0–36.0)
MCV: 87.2 fL (ref 78.0–100.0)
Platelets: 242 10*3/uL (ref 150–400)
RDW: 15.2 % (ref 11.5–15.5)
WBC: 9.3 10*3/uL (ref 4.0–10.5)

## 2013-06-10 LAB — BASIC METABOLIC PANEL
BUN: 13 mg/dL (ref 6–23)
Calcium: 8.6 mg/dL (ref 8.4–10.5)
Chloride: 107 mEq/L (ref 96–112)
Creatinine, Ser: 0.67 mg/dL (ref 0.50–1.10)
GFR calc Af Amer: >90 mL/min (ref >90)
GFR calc non Af Amer: 86 mL/min — ABNORMAL LOW (ref >90)
Potassium: 4.6 mEq/L (ref 3.5–5.1)
Sodium: 139 mEq/L (ref 135–145)

## 2013-06-10 MED ORDER — SODIUM CHLORIDE 0.9 % IV BOLUS (SEPSIS)
500.0000 mL | Freq: Once | INTRAVENOUS | Status: AC
  Administered 2013-06-10: 500 mL via INTRAVENOUS

## 2013-06-10 MED ORDER — LOSARTAN POTASSIUM 50 MG PO TABS
50.0000 mg | ORAL_TABLET | Freq: Every day | ORAL | Status: DC
  Administered 2013-06-10 – 2013-06-12 (×3): 50 mg via ORAL
  Filled 2013-06-10 (×4): qty 1

## 2013-06-10 MED ORDER — CELECOXIB 200 MG PO CAPS
200.0000 mg | ORAL_CAPSULE | Freq: Every day | ORAL | Status: DC
  Administered 2013-06-10 – 2013-06-12 (×3): 200 mg via ORAL
  Filled 2013-06-10 (×3): qty 1

## 2013-06-10 NOTE — Evaluation (Signed)
Physical Therapy Evaluation Patient Details Name: Jennifer Dyer MRN: [id number] DOB: 1940/07/30 Today's Date: 06/10/2013 Time: 4098-1191 PT Time Calculation (min): 41 min  PT Assessment / Plan / Recommendation History of Present Illness  Pt is a 72 y/o female admitted s/p L TKA.  Clinical Impression  This patient presents with acute pain and decreased functional independence following the above mentioned procedure. At the time of PT eval, pt was able to perform functional mobility and ambulation with min to mod assist for balance. Pt showed difficulty with initiating movement and quad activation during therapeutic exercise, and often reports that she is performing an exercise when no quad activation was noted. This patient is appropriate for skilled PT interventions to address functional limitations, improve safety and independence with functional mobility, and return to PLOF.     PT Assessment  Patient needs continued PT services    Follow Up Recommendations  SNF    Does the patient have the potential to tolerate intense rehabilitation      Barriers to Discharge        Equipment Recommendations  None recommended by PT    Recommendations for Other Services     Frequency 7X/week    Precautions / Restrictions Precautions Precautions: Knee;Fall Precaution Comments: Discussed towel roll/footsie roll under heel, and no pillow under knee. Required Braces or Orthoses: Knee Immobilizer - Left Knee Immobilizer - Left: On at all times Restrictions Weight Bearing Restrictions: Yes LLE Weight Bearing: Weight bearing as tolerated   Pertinent Vitals/Pain Pt reports dizziness during OOB activity, and resolution of symptoms after seated back in recliner.      Mobility  Bed Mobility Bed Mobility: Not assessed Details for Bed Mobility Assistance: Pt received sitting in recliner Transfers Transfers: Sit to Stand;Stand to Sit Sit to Stand: 3: Mod assist;With upper extremity assist;With  armrests;From chair/3-in-1 Stand to Sit: 4: Min assist;With upper extremity assist;With armrests;To chair/3-in-1 Details for Transfer Assistance: VC's for hand placement and LLE positioning. Pt cued to come to full stand when performing hygiene from [facility], as she was leaning on the arm rest and causing the commode to tip/slide. Ambulation/Gait Ambulation/Gait Assistance: 4: Min assist;3: Mod assist Ambulation Distance (Feet): 30 Feet Assistive device: Rolling walker Ambulation/Gait Assistance Details: VC's for sequencing and safety awareness with the RW. Pt required min assist for walker positioning, and mod assist at times for balance recovery.  Gait Pattern: Step-to pattern;Decreased stride length;Wide base of support Gait velocity: Decreased    Exercises Total Joint Exercises Ankle Circles/Pumps: 10 reps Quad Sets: 10 reps Heel Slides: 10 reps;Seated Goniometric ROM: 60 AAROM   PT Diagnosis: Difficulty walking;Acute pain  PT Problem List: Decreased strength;Decreased range of motion;Decreased balance;Decreased activity tolerance;Decreased mobility;Decreased knowledge of use of DME;Decreased safety awareness;Decreased knowledge of precautions;Pain PT Treatment Interventions: DME instruction;Gait training;Stair training;Functional mobility training;Therapeutic activities;Therapeutic exercise;Neuromuscular re-education;Patient/family education     PT Goals(Current goals can be found in the care plan section) Acute Rehab PT Goals Patient Stated Goal: To SNF and then home. PT Goal Formulation: With patient Time For Goal Achievement: 06/17/13 Potential to Achieve Goals: Good  Visit Information  Last PT Received On: 06/10/13 Assistance Needed: +1 PT/OT/SLP Co-Evaluation/Treatment: Yes Reason for Co-Treatment: For patient/therapist safety PT goals addressed during session: Mobility/safety with mobility;Balance;Proper use of DME;Strengthening/ROM OT goals addressed during session: ADL's  and self-care;Proper use of Adaptive equipment and DME History of Present Illness: Pt is a 72 y/o female admitted s/p L TKA.       Prior Functioning  Home  Living Family/patient expects to be discharged to:: Other (Comment) ([facility] in [place]) Living Arrangements: Alone Prior Function Level of Independence: Independent Communication Communication: No difficulties Dominant Hand: Right    Cognition  Cognition Arousal/Alertness: Awake/alert Behavior During Therapy: WFL for tasks assessed/performed Overall Cognitive Status: Impaired/Different from baseline Area of Impairment: Problem solving Problem Solving: Slow processing;Difficulty sequencing    Extremity/Trunk Assessment Upper Extremity Assessment Upper Extremity Assessment: Overall WFL for tasks assessed Lower Extremity Assessment Lower Extremity Assessment: LLE deficits/detail LLE Deficits / Details: Decreased strength and AROM consistent with L TKA. LLE: Unable to fully assess due to pain Cervical / Trunk Assessment Cervical / Trunk Assessment: Kyphotic   Balance Balance Balance Assessed: Yes Dynamic Standing Balance Dynamic Standing - Balance Support: No upper extremity supported;During functional activity Dynamic Standing - Level of Assistance: 3: Mod assist Dynamic Standing - Comments: At times during ambulation and while performing hygiene at commode  End of Session PT - End of Session Equipment Utilized During Treatment: Gait belt;Left knee immobilizer Activity Tolerance: Patient tolerated treatment well Patient left: in chair;with call bell/phone within reach Nurse Communication: Mobility status CPM Left Knee CPM Left Knee: Off  GP     Ruthann Cancer 06/10/2013, 12:17 PM  Ruthann Cancer, PT, DPT [phone]

## 2013-06-10 NOTE — Progress Notes (Signed)
Orthopedic Tech Progress Note Patient Details:  Jennifer Dyer [birth year] [id number]  Patient ID: Jennifer Dyer, female   DOB: 11-29-1940, 72 y.o.   MRN: [id number] Placed pt's lle in cpm @ 0-60 degrees @ 2140  [name] 06/10/2013, 9:40 PM

## 2013-06-10 NOTE — Clinical Social Work Psychosocial (Signed)
Clinical Social Work Department  BRIEF PSYCHOSOCIAL ASSESSMENT  Patient: TINZLEE CRAKER Account Number: [id number]   Admit date: 06/09/13 Clinical Social Worker Sabino Niemann, MSW Date/Time: 06/10/2013 1:48 PM Referred by: Physician Date Referred:  Referred for   SNF Placement   Other Referral:  Interview type: Patient  Other interview type: PSYCHOSOCIAL DATA  Living Status: Alone Admitted from facility:  Level of care:  Primary support name: Maisie Fus Primary support relationship to patient: Son Degree of support available:  Strong and vested  CURRENT CONCERNS  Current Concerns   Post-Acute Placement   Other Concerns:  SOCIAL WORK ASSESSMENT / PLAN  CSW met with pt re: PT recommendation for SNF.   Pt lives alone  CSW explained placement process and answered questions.   Pt reports [facility]  as her preference    CSW completed FL2 and initiated SNF search.     Assessment/plan status: Information/Referral to [facility]  Other assessment/ plan:  Information/referral to community resources:  SNF     PATIENT'S/FAMILY'S RESPONSE TO PLAN OF CARE:  Pt  reports she is agreeable to ST SNF in order to increase strength and independence with mobility prior to returning home  Pt verbalized understanding of placement process and appreciation for CSW assist.   Sabino Niemann, MSW, LCSWA [phone]

## 2013-06-10 NOTE — Clinical Social Work Placement (Addendum)
Clinical Social Work Department  CLINICAL SOCIAL WORK PLACEMENT NOTE    Patient:Jennifer Dyer  Account Number: [id number]  Admit date: 06/09/13  Clinical Social Worker: Sabino Niemann LCSWA Date/time:06/10/2013 1:51 PM  Clinical Social Work is seeking post-discharge placement for this patient at the following level of care: SKILLED NURSING (*CSW will update this form in Epic as items are completed)  12/16/2014Patient/family provided with [facility] Department of Clinical Social Work's list of facilities offering this level of care within the geographic area requested by the patient (or if unable, by the patient's family).  06/10/2013 Patient/family informed of their freedom to choose among providers that offer the needed level of care, that participate in Medicare, Medicaid or managed care program needed by the patient, have an available bed and are willing to accept the patient.  06/10/2013 Patient/family informed of [facility]' ownership interest in [facility], as well as of the fact that they are under no obligation to receive care at this facility.  PASARR submitted to EDS on 06/10/2013 PASARR number received from EDS on 06/10/2013 FL2 transmitted to all facilities in geographic area requested by pt/family on 06/10/2013 FL2 transmitted to all facilities within larger geographic area on  Patient informed that his/her managed care company has contracts with or will negotiate with certain facilities, including the following:  Patient/family informed of bed offers received:   Patient chooses bed at [facility] Physician recommends and patient chooses bed at  Patient to be transferred to on 06/13/14 Patient to be transferred to facility by  [facility] The following physician request were entered in Epic:  Additional Comments:   Submitted [employer]

## 2013-06-10 NOTE — Progress Notes (Signed)
Subjective: 1 Day Post-Op Procedure(s) (LRB): TOTAL KNEE ARTHROPLASTY (Left) Patient reports pain as 4 on 0-10 scale.    Objective: Vital signs in last 24 hours: Temp:  [97.5 F (36.4 C)-98.3 F (36.8 C)] 97.7 F (36.5 C) (12/16 0614) Pulse Rate:  [58-85] 84 (12/16 0614) Resp:  [12-20] 18 (12/16 0614) BP: (121-162)/(48-81) 156/72 mmHg (12/16 0614) SpO2:  [90 %-99 %] 99 % (12/16 0614)  Intake/Output from previous day: 12/15 0701 - 12/16 0700 In: 2040 [P.O.:240; I.V.:1800] Out: 2450 [Urine:2050; Drains:400] Intake/Output this shift:     Recent Labs  06/10/13 0440  HGB 12.0    Recent Labs  06/10/13 0440  WBC 9.3  RBC 4.05  HCT 35.3*  PLT 242    Recent Labs  06/10/13 0440  NA 139  K 4.6  CL 107  CO2 22  BUN 13  CREATININE 0.67  GLUCOSE 136*  CALCIUM 8.6   No results found for this basename: LABPT, INR,  in the last 72 hours  Neurologically intact ABD soft Neurovascular intact Sensation intact distally Intact pulses distally Dorsiflexion/Plantar flexion intact Incision: moderate drainage  Assessment/Plan: 1 Day Post-Op Procedure(s) (LRB): TOTAL KNEE ARTHROPLASTY (Left) Principal Problem:   Left knee DJD Active Problems:   HTN (hypertension)   Obesity   SUI (stress urinary incontinence, female)   Depression   DJD (degenerative joint disease) of knee  Advance diet Up with therapy D/C IV fluids after NS bolus of 500 cc  Jennifer Dyer J 06/10/2013, 8:14 AM

## 2013-06-10 NOTE — Evaluation (Signed)
Occupational Therapy Evaluation and Discharge Patient Details Name: QUITA MCGRORY MRN: [id number] DOB: [birth year] Today's Date: 06/10/2013 Time: 7829-5621 OT Time Calculation (min): 32 min  OT Assessment / Plan / Recommendation History of present illness LTKA   Clinical Impression   This 72 yo female admitted and underwent above presents to acute OT with decreased AROM LLE, increased pain LLE, obesity, and decreased balance when up on her feet thus affecting her independence with BADLs. Will benefit from continued OT at SNF to get to an Independent/Mod I level to return home.    OT Assessment  All further OT needs can be met in the next venue of care    Follow Up Recommendations  SNF    Barriers to Discharge Decreased caregiver support             Precautions / Restrictions Precautions Precautions: Knee;Fall Required Braces or Orthoses: Knee Immobilizer - Left Knee Immobilizer - Left: On at all times Restrictions LLE Weight Bearing: Weight bearing as tolerated   Pertinent Vitals/Pain 7/10 LLE; repostioned    ADL  Eating/Feeding: Independent Where Assessed - Eating/Feeding: Chair Grooming: Set up Where Assessed - Grooming: Supported sitting Upper Body Bathing: Set up Where Assessed - Upper Body Bathing: Supported sitting Lower Body Bathing: Maximal assistance Where Assessed - Lower Body Bathing: Supported sit to stand Upper Body Dressing: Minimal assistance Where Assessed - Upper Body Dressing: Supported sitting Lower Body Dressing: +1 Total assistance Where Assessed - Lower Body Dressing: Supported sit to Pharmacist, hospital: Moderate assistance (due to tendency to lean posteriorly with stand to sit) Toilet Transfer Method: Sit to stand Toilet Transfer Equipment: Raised toilet seat with arms (or 3-in-1 over toilet) Toileting - Clothing Manipulation and Hygiene: Moderate assistance (due to tendency to lean posteriorly with stand to sit) Where Assessed - Toileting  Clothing Manipulation and Hygiene: Sit to stand from 3-in-1 or toilet Equipment Used: Gait belt;Rolling walker;Knee Immobilizer Transfers/Ambulation Related to ADLs: Min A stand>sit; mod A at times for sit>stand due to posterior lean; mod A at times for ambulation due to balance    OT Diagnosis: Generalized weakness;Acute pain;Cognitive deficits  OT Problem List: Decreased strength;Decreased range of motion;Decreased activity tolerance;Impaired balance (sitting and/or standing);Pain;Decreased cognition;Decreased knowledge of use of DME or AE;Obesity    Acute Rehab OT Goals Patient Stated Goal: To SNF and then home.  Visit Information  Last OT Received On: 06/10/13 Assistance Needed: +1 PT/OT/SLP Co-Evaluation/Treatment: Yes Reason for Co-Treatment: For patient/therapist safety OT goals addressed during session: ADL's and self-care;Proper use of Adaptive equipment and DME History of Present Illness: LTKA       Prior Functioning     Home Living Family/patient expects to be discharged to:: Other (Comment) (REHAB AT [facility]) Living Arrangements: Alone Prior Function Level of Independence: Independent Communication Communication: No difficulties Dominant Hand: Right         Vision/Perception Vision - History Patient Visual Report: No change from baseline   Cognition  Cognition Arousal/Alertness: Awake/alert Behavior During Therapy: WFL for tasks assessed/performed Overall Cognitive Status: Impaired/Different from baseline Area of Impairment: Problem solving Problem Solving: Slow processing;Difficulty sequencing    Extremity/Trunk Assessment Upper Extremity Assessment Upper Extremity Assessment: Overall WFL for tasks assessed Lower Extremity Assessment Lower Extremity Assessment: Defer to PT evaluation     Mobility Bed Mobility Details for Bed Mobility Assistance: Pt up in recliner upon arrival Transfers Transfers: Sit to Stand;Stand to Sit Sit to Stand: 3: Mod  assist;With upper extremity assist;With armrests;From chair/3-in-1 Stand to Sit: 4:  Min assist;With upper extremity assist;With armrests;To chair/3-in-1 Details for Transfer Assistance: VCs for safe hand placement and positioning of LLE; tendency with posterior lean with sit>stand        Balance Balance Balance Assessed: Yes Dynamic Standing Balance Dynamic Standing - Balance Support: No upper extremity supported;During functional activity Dynamic Standing - Level of Assistance: 3: Mod assist Dynamic Standing - Comments: Mod A at times due to tendency to lean posteriorly while performing toileting hygiene   End of Session OT - End of Session Equipment Utilized During Treatment: Gait belt;Rolling walker;Left knee immobilizer Activity Tolerance: Patient limited by fatigue Patient left: in chair;with call bell/phone within reach Nurse Communication: Mobility status       Evette Georges [phone] 06/10/2013, 11:31 AM

## 2013-06-11 LAB — BASIC METABOLIC PANEL
BUN: 20 mg/dL (ref 6–23)
CO2: 27 mEq/L (ref 19–32)
Calcium: 9.1 mg/dL (ref 8.4–10.5)
Chloride: 109 mEq/L (ref 96–112)
Creatinine, Ser: 0.79 mg/dL (ref 0.50–1.10)
GFR calc Af Amer: >90 mL/min (ref >90)
GFR calc non Af Amer: 82 mL/min — ABNORMAL LOW (ref >90)
Glucose, Bld: 109 mg/dL — ABNORMAL HIGH (ref 70–99)
Potassium: 4.3 mEq/L (ref 3.5–5.1)
Sodium: 144 mEq/L (ref 135–145)

## 2013-06-11 LAB — CBC
HCT: 33.2 % — ABNORMAL LOW (ref 36.0–46.0)
Hemoglobin: 11.5 g/dL — ABNORMAL LOW (ref 12.0–15.0)
MCH: 29.9 pg (ref 26.0–34.0)
MCHC: 34.6 g/dL (ref 30.0–36.0)
MCV: 86.2 fL (ref 78.0–100.0)
Platelets: 249 10*3/uL (ref 150–400)
RBC: 3.85 MIL/uL — ABNORMAL LOW (ref 3.87–5.11)
RDW: 15.3 % (ref 11.5–15.5)
WBC: 11.1 10*3/uL — ABNORMAL HIGH (ref 4.0–10.5)

## 2013-06-11 NOTE — Progress Notes (Signed)
Physical Therapy Treatment Patient Details Name: Jennifer Dyer MRN: [id number] DOB: 1940/12/14 Today's Date: 06/11/2013 Time: 2536-6440 PT Time Calculation (min): 23 min  PT Assessment / Plan / Recommendation  History of Present Illness Pt is a 72 y/o female admitted s/p L TKA.   PT Comments   Pt progressing well with mobility but cont to recommend SNF at d/c due to pt will not have 24 hr assist.    Follow Up Recommendations  SNF     Does the patient have the potential to tolerate intense rehabilitation     Barriers to Discharge        Equipment Recommendations  None recommended by PT    Recommendations for Other Services    Frequency 7X/week   Progress towards PT Goals Progress towards PT goals: Progressing toward goals  Plan Current plan remains appropriate    Precautions / Restrictions Precautions Precautions: Knee;Fall Precaution Comments: Discussed towel roll/footsie roll under heel, and no pillow under knee. Required Braces or Orthoses: Knee Immobilizer - Left Knee Immobilizer - Left: On at all times Restrictions LLE Weight Bearing: Weight bearing as tolerated   Pertinent Vitals/Pain "I don't really have any"    Mobility  Bed Mobility Bed Mobility: Supine to Sit;Sitting - Scoot to Edge of Bed Supine to Sit: 5: Supervision;With rails;HOB flat Sitting - Scoot to Edge of Bed: 6: Modified independent (Device/Increase time) Transfers Transfers: Sit to Stand;Stand to Sit Sit to Stand: 4: Min assist;With upper extremity assist;From bed Stand to Sit: 4: Min assist;With upper extremity assist;With armrests;To chair/3-in-1 Details for Transfer Assistance: (A) for balance as pt with posterior lean with initial standing.  cues for safest hand placement Ambulation/Gait Ambulation/Gait Assistance: 4: Min guard Ambulation Distance (Feet): 120 Feet Assistive device: Rolling walker Ambulation/Gait Assistance Details: cues for sequencing, safe RW advancement, & to look ahead.   2nd half of distance pt was able to increase fluidity & speed.   Gait Pattern: Step-through pattern;Decreased stride length General Gait Details: Trialed ambulation without KI due to pt able to perform SLR's without extension lag.  No knee buckling noted.  Stairs: No Wheelchair Mobility Wheelchair Mobility: No    Exercises Total Joint Exercises Ankle Circles/Pumps: AROM;Both;10 reps Quad Sets: AROM;Strengthening;Both;10 reps Heel Slides: AROM;Strengthening;Left;10 reps Hip ABduction/ADduction: AROM;Strengthening;Left;10 reps Straight Leg Raises: AROM;Strengthening;Left;10 reps     PT Goals (current goals can now be found in the care plan section) Acute Rehab PT Goals Patient Stated Goal: To SNF and then home. PT Goal Formulation: With patient Time For Goal Achievement: 06/17/13 Potential to Achieve Goals: Good  Visit Information  Last PT Received On: 06/11/13 Assistance Needed: +1 History of Present Illness: Pt is a 72 y/o female admitted s/p L TKA.    Subjective Data  Patient Stated Goal: To SNF and then home.   Cognition  Cognition Arousal/Alertness: Awake/alert Behavior During Therapy: WFL for tasks assessed/performed Overall Cognitive Status: Within Functional Limits for tasks assessed    Balance     End of Session PT - End of Session Equipment Utilized During Treatment: Gait belt Activity Tolerance: Patient tolerated treatment well Patient left: in chair;with call bell/phone within reach;with family/visitor present Nurse Communication: Mobility status CPM Left Knee CPM Left Knee: On Left Knee Flexion (Degrees): 60   GP     Lara Mulch 06/11/2013, 3:09 PM   Verdell Face, PTA [phone] 06/11/2013

## 2013-06-12 LAB — BASIC METABOLIC PANEL
Chloride: 108 mEq/L (ref 96–112)
GFR calc Af Amer: >90 mL/min (ref >90)
GFR calc non Af Amer: 83 mL/min — ABNORMAL LOW (ref >90)
Glucose, Bld: 84 mg/dL (ref 70–99)
Potassium: 4.3 mEq/L (ref 3.5–5.1)
Sodium: 141 mEq/L (ref 135–145)

## 2013-06-12 LAB — CBC
HCT: 33.2 % — ABNORMAL LOW (ref 36.0–46.0)
Hemoglobin: 11.2 g/dL — ABNORMAL LOW (ref 12.0–15.0)
MCHC: 33.7 g/dL (ref 30.0–36.0)
MCV: 87.4 fL (ref 78.0–100.0)
RDW: 15.3 % (ref 11.5–15.5)
WBC: 7.2 10*3/uL (ref 4.0–10.5)

## 2013-06-12 MED ORDER — OXYCODONE HCL 5 MG PO TABS: ORAL_TABLET | ORAL | Status: DC

## 2013-06-12 MED ORDER — BISACODYL 5 MG PO TBEC: DELAYED_RELEASE_TABLET | ORAL | Status: DC

## 2013-06-12 MED ORDER — CELECOXIB 200 MG PO CAPS: 200.0000 mg | ORAL_CAPSULE | Freq: Every day | ORAL | Status: DC

## 2013-06-12 MED ORDER — ASPIRIN 325 MG PO TBEC: DELAYED_RELEASE_TABLET | ORAL | Status: DC

## 2013-06-12 MED ORDER — DSS 100 MG PO CAPS: ORAL_CAPSULE | ORAL | Status: DC

## 2013-06-12 NOTE — Discharge Summary (Signed)
Patient ID: Jennifer Dyer MRN: [id number] DOB/AGE: July 25, 1940 72 y.o.  Admit date: 06/09/2013 Discharge date: 06/12/2013  Admission Diagnoses:  Principal Problem:   Left knee DJD Active Problems:   HTN (hypertension)   Obesity   SUI (stress urinary incontinence, female)   Depression   DJD (degenerative joint disease) of knee   Discharge Diagnoses:  Same  Past Medical History  Diagnosis Date  . HTN (hypertension)   . Obesity   . Abnormal uterine bleeding (AUB)   . SUI (stress urinary incontinence, female)   . Left knee DJD   . Depression     Surgeries: Procedure(s): TOTAL KNEE ARTHROPLASTY on 06/09/2013   Consultants:    Discharged Condition: Improved  Hospital Course: Jennifer Dyer is an 72 y.o. female who was admitted 06/09/2013 for operative treatment ofLeft knee DJD. Patient has severe unremitting pain that affects sleep, daily activities, and work/hobbies. After pre-op clearance the patient was taken to the operating room on 06/09/2013 and underwent  Procedure(s): TOTAL KNEE ARTHROPLASTY.    Patient was given perioperative antibiotics: Anti-infectives   Start     Dose/Rate Route Frequency Ordered Stop   06/09/13 1400  ceFAZolin (ANCEF) IVPB 2 g/50 mL premix     2 g 100 mL/hr over 30 Minutes Intravenous Every 6 hours 06/09/13 1240 06/09/13 2142   06/09/13 0600  ceFAZolin (ANCEF) 3 g in dextrose 5 % 50 mL IVPB     3 g 160 mL/hr over 30 Minutes Intravenous On call to O.R. 06/08/13 1435 06/09/13 0730       Patient was given sequential compression devices, early ambulation, and chemoprophylaxis to prevent DVT.  Patient benefited maximally from hospital stay and there were no complications.    Recent vital signs: Patient Vitals for the past 24 hrs:  BP Temp Temp src Pulse Resp SpO2  06/12/13 0618 132/84 mmHg 97.9 F (36.6 C) - 77 18 93 %  06/11/13 2155 139/53 mmHg 98.1 F (36.7 C) Oral 73 17 94 %  06/11/13 1600 - - - - 16 -  06/11/13 1355 153/75 mmHg 98 F  (36.7 C) - 87 18 95 %  06/11/13 1200 - - - - 18 -  06/11/13 1056 145/65 mmHg - - - - -  06/11/13 0800 - - - - 16 -     Recent laboratory studies:  Recent Labs  06/11/13 0405 06/12/13 0458  WBC 11.1* 7.2  HGB 11.5* 11.2*  HCT 33.2* 33.2*  PLT 249 234  NA 144 141  K 4.3 4.3  CL 109 108  CO2 27 25  BUN 20 21  CREATININE 0.79 0.75  GLUCOSE 109* 84  CALCIUM 9.1 8.5     Discharge Medications:     Medication List         amLODipine 5 MG tablet  Commonly known as:  NORVASC  Take 5 mg by mouth daily.     aspirin 325 MG EC tablet  1 tab a day for the next 30 days to prevent blood clots     bisacodyl 5 MG EC tablet  Commonly known as:  DULCOLAX  Take 2 tablets every night with dinner until bowel movement.  LAXITIVE.  Restart if two days since last bowel movement     calcium carbonate 1250 MG capsule  Take 1,200 mg by mouth 2 (two) times daily with a meal.     celecoxib 200 MG capsule  Commonly known as:  CELEBREX  Take 1 capsule (200 mg total) by mouth  daily.     citalopram 20 MG tablet  Commonly known as:  CELEXA  Take 20 mg by mouth daily.     clobetasol ointment 0.05 %  Commonly known as:  TEMOVATE  Apply 1 application topically 2 (two) times daily.     DSS 100 MG Caps  1 tab 2 times a day while on narcotics.  STOOL SOFTENER     fish oil-omega-3 fatty acids 1000 MG capsule  Take 2,400 mg by mouth 2 (two) times daily.     losartan-hydrochlorothiazide 50-12.5 MG per tablet  Commonly known as:  HYZAAR  Take 1 tablet by mouth daily.     multivitamin tablet  Take 1 tablet by mouth daily.     MYRBETRIQ 50 MG Tb24 tablet  Generic drug:  mirabegron ER  Take 50 mg by mouth daily.     oxyCODONE 5 MG immediate release tablet  Commonly known as:  Oxy IR/ROXICODONE  1-2 tablets every 4-6 hrs as needed for pain     Vitamin D 2000 UNITS Caps  Take 2,000 Units by mouth daily.        Diagnostic Studies: No results found.  Disposition: Final discharge  disposition not confirmed      Discharge Orders   Future Appointments Provider Department Dept Phone   02/24/2014 1:00 PM Verner Chol, CNM [facility] [phone]   Future Orders Complete By Expires   Call MD / Call 911  As directed    Comments:     If you experience chest pain or shortness of breath, CALL 911 and be transported to the hospital emergency room.  If you develope a fever above 101 F, pus (white drainage) or increased drainage or redness at the wound, or calf pain, call your surgeon's office.   Change dressing  As directed    Comments:     Change the dressing daily with sterile 4 x 4 inch gauze dressing and apply TED hose.  You may clean the incision with alcohol prior to redressing.   Constipation Prevention  As directed    Comments:     Drink plenty of fluids.  Prune juice may be helpful.  You may use a stool softener, such as Colace (over the counter) 100 mg twice a day.  Use MiraLax (over the counter) for constipation as needed.   CPM  As directed    Comments:     Continuous passive motion machine (CPM):      Use the CPM from 0 to 90 for 6 hours per day.       You may break it up into 2 or 3 sessions per day.      Use CPM for 2 weeks or until you are told to stop.   Diet - low sodium heart healthy  As directed    Discharge instructions  As directed    Comments:     Total Knee Replacement Care After Refer to this sheet in the next few weeks. These discharge instructions provide you with general information on caring for yourself after you leave the hospital. Your caregiver may also give you specific instructions. Your treatment has been planned according to the most current medical practices available, but unavoidable complications sometimes occur. If you have any problems or questions after discharge, please call your caregiver. Regaining a near full range of motion of your knee within the first 3 to 6 weeks after surgery is critical. HOME CARE  INSTRUCTIONS  You may resume a  normal diet and activities as directed.  Perform exercises as directed.  Place gray foam block, curve side up under heel at all times except when in CPM or when walking.  DO NOT modify, tear, cut, or change in any way the gray foam block. You will receive physical therapy daily  Take showers instead of baths until informed otherwise.  You may shower on Sunday.  Please wash whole leg including wound with soap and water  Change bandages (dressings)daily It is OK to take over-the-counter tylenol in addition to the oxycodone for pain, discomfort, or fever. Oxycodone is VERY constipating.  Please take stool softener twice a day and laxatives daily until bowels are regular Eat a well-balanced diet.  Avoid lifting or driving until you are instructed otherwise.  Make an appointment to see your caregiver for stitches (suture) or staple removal as directed.  If you have been sent home with a continuous passive motion machine (CPM machine), 0-90 degrees 6 hrs a day   2 hrs a shift SEEK MEDICAL CARE IF: You have swelling of your calf or leg.  You develop shortness of breath or chest pain.  You have redness, swelling, or increasing pain in the wound.  There is pus or any unusual drainage coming from the surgical site.  You notice a bad smell coming from the surgical site or dressing.  The surgical site breaks open after sutures or staples have been removed.  There is persistent bleeding from the suture or staple line.  You are getting worse or are not improving.  You have any other questions or concerns.  SEEK IMMEDIATE MEDICAL CARE IF:  You have a fever.  You develop a rash.  You have difficulty breathing.  You develop any reaction or side effects to medicines given.  Your knee motion is decreasing rather than improving.  MAKE SURE YOU:  Understand these instructions.  Will watch your condition.  Will get help right away if you are not doing well or get worse.   Do  not put a pillow under the knee. Place it under the heel.  As directed    Comments:     Place gray foam block, curve side up under heel at all times except when in CPM or when walking.  DO NOT modify, tear, cut, or change in any way the gray foam block.   Increase activity slowly as tolerated  As directed    TED hose  As directed    Comments:     Use stockings (TED hose) for 2 weeks on both leg(s).  You may remove them at night for sleeping.      Follow-up Information   Follow up with Nilda Simmer, MD On 06/23/2013. (appt time 2:30pm)    Specialty:  Orthopedic Surgery   Contact information:   [address] [place] [address] [phone]        Signed: Pascal Lux 06/12/2013, 7:16 AM

## 2013-06-12 NOTE — Progress Notes (Signed)
Patient plans to discharge to [facility] without prior authorization. Patient and patient's family understand the financial implications of that. They are agreeable to proceed with the discharge  Knowing the risks. Clinical social worker assisted with patient discharge to skilled nursing facility, [facility].   CSW copied chart and added all important documents.  Clinical Social Worker will sign off for now as social work intervention is no longer needed.   Sabino Niemann, MSW, [employer] [phone]

## 2013-06-12 NOTE — Progress Notes (Signed)
Physical Therapy Treatment Patient Details Name: Jennifer Dyer MRN: [id number] DOB: 1940-10-27 Today's Date: 06/12/2013 Time: 4098-1191 PT Time Calculation (min): 24 min  PT Assessment / Plan / Recommendation  History of Present Illness Pt is a 72 y/o female admitted s/p L TKA.   PT Comments   Pt cont's to move well.    Follow Up Recommendations  SNF     Does the patient have the potential to tolerate intense rehabilitation     Barriers to Discharge        Equipment Recommendations  None recommended by PT    Recommendations for Other Services    Frequency 7X/week   Progress towards PT Goals Progress towards PT goals: Progressing toward goals  Plan Current plan remains appropriate    Precautions / Restrictions Precautions Precautions: Knee;Fall Required Braces or Orthoses: Knee Immobilizer - Left Knee Immobilizer - Left: On at all times Restrictions Weight Bearing Restrictions: Yes LLE Weight Bearing: Weight bearing as tolerated   Pertinent Vitals/Pain States "it's not my knee that hurts, it's my ankle".      Mobility  Bed Mobility Bed Mobility: Not assessed Transfers Transfers: Sit to Stand;Stand to Sit Sit to Stand: 4: Min guard;With upper extremity assist;With armrests;From chair/3-in-1 Stand to Sit: 4: Min guard;With upper extremity assist;With armrests;To chair/3-in-1 Details for Transfer Assistance: Pt demonstrated safe hand placement & technique Ambulation/Gait Ambulation/Gait Assistance: 4: Min guard Ambulation Distance (Feet): 150 Feet Assistive device: Rolling walker Ambulation/Gait Assistance Details: Cues for increased WBing LLE, increased step length/height RLE for sufficient step Gait Pattern: Step-to pattern;Decreased step length - right Gait velocity: Decreased Stairs: No Wheelchair Mobility Wheelchair Mobility: No    Exercises Total Joint Exercises Ankle Circles/Pumps: AROM;Both;15 reps Quad Sets: AROM;Strengthening;Both;15 reps Heel Slides:  AROM;Strengthening;Left;Other reps (comment) (12 reps) Straight Leg Raises: AROM;Strengthening;Left;Other reps (comment) (12 reps)      PT Goals (current goals can now be found in the care plan section) Acute Rehab PT Goals Patient Stated Goal: To SNF and then home. PT Goal Formulation: With patient Time For Goal Achievement: 06/17/13 Potential to Achieve Goals: Good  Visit Information  Last PT Received On: 06/12/13 Assistance Needed: +1 History of Present Illness: Pt is a 72 y/o female admitted s/p L TKA.    Subjective Data  Patient Stated Goal: To SNF and then home.   Cognition  Cognition Arousal/Alertness: Awake/alert Behavior During Therapy: WFL for tasks assessed/performed Overall Cognitive Status: Within Functional Limits for tasks assessed    Balance     End of Session PT - End of Session Activity Tolerance: Patient tolerated treatment well Patient left: in chair;with call bell/phone within reach Nurse Communication: Mobility status CPM Left Knee CPM Left Knee: Off   GP     Lara Mulch 06/12/2013, 11:54 AM  Verdell Face, PTA [phone] 06/12/2013

## 2013-06-12 NOTE — Progress Notes (Signed)
We are still awaiting [employer] Auth.  Sabino Niemann, MSW, [employer] [phone]

## 2013-06-12 NOTE — Progress Notes (Signed)
Pt agreed to go to [facility] in care with a friend

## 2013-06-13 NOTE — Progress Notes (Addendum)
Clinical Child psychotherapist (CSW) received call from [employer] with [employer] authorization number [id number]. CSW left message with admissions coordinator at [facility] to update her on the insurance authorization.   Hendricks Milo Weekend CSW [phone]  Admissions coordinator called CSW back and recorded insurance authorization number for her records.   Jetta Lout, LCSWA Weekend CSW [phone]

## 2013-07-28 ENCOUNTER — Other Ambulatory Visit: Payer: Self-pay

## 2013-07-28 DIAGNOSIS — Z1231 Encounter for screening mammogram for malignant neoplasm of breast: Secondary | ICD-10-CM

## 2013-07-30 ENCOUNTER — Ambulatory Visit: Payer: Medicare Other | Attending: Orthopedic Surgery | Admitting: Rehabilitation

## 2013-07-30 DIAGNOSIS — E669 Obesity, unspecified: Secondary | ICD-10-CM | POA: Insufficient documentation

## 2013-07-30 DIAGNOSIS — IMO0001 Reserved for inherently not codable concepts without codable children: Secondary | ICD-10-CM | POA: Diagnosis present

## 2013-07-30 DIAGNOSIS — M25569 Pain in unspecified knee: Secondary | ICD-10-CM | POA: Insufficient documentation

## 2013-07-31 ENCOUNTER — Ambulatory Visit: Payer: Medicare Other | Admitting: Rehabilitation

## 2013-07-31 DIAGNOSIS — IMO0001 Reserved for inherently not codable concepts without codable children: Secondary | ICD-10-CM | POA: Diagnosis not present

## 2013-08-05 ENCOUNTER — Ambulatory Visit: Payer: Medicare Other | Admitting: Rehabilitation

## 2013-08-05 DIAGNOSIS — IMO0001 Reserved for inherently not codable concepts without codable children: Secondary | ICD-10-CM | POA: Diagnosis not present

## 2013-08-06 ENCOUNTER — Ambulatory Visit: Payer: Medicare Other | Admitting: Rehabilitation

## 2013-08-06 DIAGNOSIS — IMO0001 Reserved for inherently not codable concepts without codable children: Secondary | ICD-10-CM | POA: Diagnosis not present

## 2013-08-12 ENCOUNTER — Ambulatory Visit: Payer: Medicare Other | Admitting: Rehabilitation

## 2013-08-14 ENCOUNTER — Ambulatory Visit: Payer: Medicare Other | Admitting: Rehabilitation

## 2013-08-14 DIAGNOSIS — IMO0001 Reserved for inherently not codable concepts without codable children: Secondary | ICD-10-CM | POA: Diagnosis not present

## 2013-08-19 ENCOUNTER — Ambulatory Visit: Payer: Medicare Other | Admitting: Rehabilitation

## 2013-08-21 ENCOUNTER — Ambulatory Visit: Payer: Medicare Other | Admitting: Rehabilitation

## 2013-08-22 ENCOUNTER — Ambulatory Visit: Payer: Medicare Other | Admitting: Rehabilitation

## 2013-08-22 DIAGNOSIS — IMO0001 Reserved for inherently not codable concepts without codable children: Secondary | ICD-10-CM | POA: Diagnosis not present

## 2013-08-26 ENCOUNTER — Ambulatory Visit: Payer: Medicare Other | Admitting: Rehabilitation

## 2013-08-26 ENCOUNTER — Ambulatory Visit
Admission: RE | Admit: 2013-08-26 | Discharge: 2013-08-26 | Disposition: A | Discharge status: Home / Self Care | Payer: Medicare Other | Source: Ambulatory Visit

## 2013-08-26 DIAGNOSIS — Z1231 Encounter for screening mammogram for malignant neoplasm of breast: Secondary | ICD-10-CM

## 2013-08-28 ENCOUNTER — Ambulatory Visit: Payer: Medicare Other | Admitting: Rehabilitation

## 2013-08-29 ENCOUNTER — Ambulatory Visit: Payer: Medicare Other | Admitting: Rehabilitation

## 2013-10-29 ENCOUNTER — Emergency Department (HOSPITAL_BASED_OUTPATIENT_CLINIC_OR_DEPARTMENT_OTHER)
Admission: EM | Admit: 2013-10-29 | Discharge: 2013-10-29 | Disposition: A | Discharge status: Home / Self Care | Payer: Medicare Other | Attending: Emergency Medicine | Admitting: Emergency Medicine

## 2013-10-29 ENCOUNTER — Emergency Department (HOSPITAL_BASED_OUTPATIENT_CLINIC_OR_DEPARTMENT_OTHER): Payer: Medicare Other

## 2013-10-29 ENCOUNTER — Encounter (HOSPITAL_BASED_OUTPATIENT_CLINIC_OR_DEPARTMENT_OTHER): Payer: Self-pay | Admitting: Emergency Medicine

## 2013-10-29 DIAGNOSIS — F411 Generalized anxiety disorder: Secondary | ICD-10-CM | POA: Insufficient documentation

## 2013-10-29 DIAGNOSIS — Z791 Long term (current) use of non-steroidal anti-inflammatories (NSAID): Secondary | ICD-10-CM | POA: Insufficient documentation

## 2013-10-29 DIAGNOSIS — E669 Obesity, unspecified: Secondary | ICD-10-CM | POA: Insufficient documentation

## 2013-10-29 DIAGNOSIS — IMO0002 Reserved for concepts with insufficient information to code with codable children: Secondary | ICD-10-CM | POA: Insufficient documentation

## 2013-10-29 DIAGNOSIS — M549 Dorsalgia, unspecified: Secondary | ICD-10-CM | POA: Insufficient documentation

## 2013-10-29 DIAGNOSIS — Z7982 Long term (current) use of aspirin: Secondary | ICD-10-CM | POA: Insufficient documentation

## 2013-10-29 DIAGNOSIS — M1710 Unilateral primary osteoarthritis, unspecified knee: Secondary | ICD-10-CM | POA: Insufficient documentation

## 2013-10-29 DIAGNOSIS — G589 Mononeuropathy, unspecified: Secondary | ICD-10-CM | POA: Insufficient documentation

## 2013-10-29 DIAGNOSIS — M25519 Pain in unspecified shoulder: Secondary | ICD-10-CM | POA: Insufficient documentation

## 2013-10-29 DIAGNOSIS — I10 Essential (primary) hypertension: Secondary | ICD-10-CM | POA: Insufficient documentation

## 2013-10-29 DIAGNOSIS — Z79899 Other long term (current) drug therapy: Secondary | ICD-10-CM | POA: Insufficient documentation

## 2013-10-29 DIAGNOSIS — I252 Old myocardial infarction: Secondary | ICD-10-CM | POA: Insufficient documentation

## 2013-10-29 HISTORY — DX: Polyneuropathy, unspecified: G62.9

## 2013-10-29 LAB — PROTIME-INR
INR: 1.05 (ref 0.00–1.49)
Prothrombin Time: 13.5 seconds (ref 11.6–15.2)

## 2013-10-29 LAB — TROPONIN I: Troponin I: <0.3 ng/mL (ref <0.30)

## 2013-10-29 LAB — COMPREHENSIVE METABOLIC PANEL
ALBUMIN: 3.9 g/dL (ref 3.5–5.2)
ALK PHOS: 85 U/L (ref 39–117)
ALT: 11 U/L (ref 0–35)
AST: 14 U/L (ref 0–37)
BILIRUBIN TOTAL: 0.6 mg/dL (ref 0.3–1.2)
BUN: 21 mg/dL (ref 6–23)
CO2: 24 mEq/L (ref 19–32)
Calcium: 9.5 mg/dL (ref 8.4–10.5)
Chloride: 104 mEq/L (ref 96–112)
Creatinine, Ser: 1 mg/dL (ref 0.50–1.10)
GFR calc Af Amer: 64 mL/min — ABNORMAL LOW (ref >90)
GFR calc non Af Amer: 55 mL/min — ABNORMAL LOW (ref >90)
GLUCOSE: 118 mg/dL — AB (ref 70–99)
POTASSIUM: 3.6 meq/L — AB (ref 3.7–5.3)
Sodium: 145 mEq/L (ref 137–147)
Total Protein: 7 g/dL (ref 6.0–8.3)

## 2013-10-29 LAB — CBC
HEMATOCRIT: 38.8 % (ref 36.0–46.0)
HEMOGLOBIN: 13.7 g/dL (ref 12.0–15.0)
MCH: 30.4 pg (ref 26.0–34.0)
MCHC: 35.3 g/dL (ref 30.0–36.0)
MCV: 86 fL (ref 78.0–100.0)
Platelets: 319 10*3/uL (ref 150–400)
RBC: 4.51 MIL/uL (ref 3.87–5.11)
RDW: 15.3 % (ref 11.5–15.5)
WBC: 8.8 10*3/uL (ref 4.0–10.5)

## 2013-10-29 LAB — APTT: APTT: 29 s (ref 24–37)

## 2013-10-29 LAB — D-DIMER, QUANTITATIVE: D-Dimer, Quant: 1.21 ug/mL-FEU — ABNORMAL HIGH (ref 0.00–0.48)

## 2013-10-29 MED ORDER — MORPHINE SULFATE 4 MG/ML IJ SOLN
4.0000 mg | Freq: Once | INTRAMUSCULAR | Status: AC
  Administered 2013-10-29: 4 mg via INTRAVENOUS
  Filled 2013-10-29: qty 1

## 2013-10-29 MED ORDER — MORPHINE SULFATE 4 MG/ML IJ SOLN
4.0000 mg | Freq: Once | INTRAMUSCULAR | Status: AC
  Administered 2013-10-29: 4 mg via INTRAVENOUS

## 2013-10-29 MED ORDER — MORPHINE SULFATE 4 MG/ML IJ SOLN
INTRAMUSCULAR | Status: AC
  Administered 2013-10-29: 4 mg via INTRAVENOUS
  Filled 2013-10-29: qty 1

## 2013-10-29 MED ORDER — SODIUM CHLORIDE 0.9 % IV SOLN
1000.0000 mL | INTRAVENOUS | Status: DC
Start: 2013-10-29 — End: 2013-10-30
  Administered 2013-10-29: 1000 mL via INTRAVENOUS

## 2013-10-29 MED ORDER — HYDROCODONE-ACETAMINOPHEN 5-325 MG PO TABS: 1.0000 | ORAL_TABLET | ORAL | Status: DC | PRN

## 2013-10-29 MED ORDER — CYCLOBENZAPRINE HCL 5 MG PO TABS: 5.0000 mg | ORAL_TABLET | Freq: Three times a day (TID) | ORAL | Status: DC | PRN

## 2013-10-29 MED ORDER — IOHEXOL 350 MG/ML SOLN
80.0000 mL | Freq: Once | INTRAVENOUS | Status: AC | PRN
  Administered 2013-10-29: 80 mL via INTRAVENOUS

## 2013-10-29 NOTE — ED Provider Notes (Signed)
CSN: [id number]     Arrival date & time 10/29/13  1916 History  This chart was scribed for Celene Kras, MD by Beverly Milch, ED Scribe. This patient was seen in room MH08/MH08 and the patient's care was started at 7:50 PM.    Chief Complaint  Patient presents with  . Shoulder Pain    The history is provided by the patient. No language interpreter was used.    HPI Comments: Jennifer Dyer is a 73 y.o. female who presents to the Emergency Department complaining of pain to her upper left shoulder and back that began about 2 hours ago. Pt points to pain in the left parascapular region. Pt reports she had a massage yesterday but noted no pain neither immediately after nor during. She states her pain is constant with intermittent sharp pain. She states movement, raising her arms, and deep breaths do not affect her pain. Pt denies CP, chest tenderness, SOB, and any leg swelling. Pt state she has had similar pain before but never this bad. Pt denies h/o MI. She reports a h/o pinched nerve and had surgery on her L-spine.   Past Medical History  Diagnosis Date  . HTN (hypertension)   . Obesity   . Abnormal uterine bleeding (AUB)   . SUI (stress urinary incontinence, female)   . Left knee DJD   . Depression   . Neuropathy     Past Surgical History  Procedure Laterality Date  . Appendectomy    . Knee arthroscopy Left 12/2011  . Tubal ligation Bilateral   . Lumbar disc surgery  12/94  . Ankle fracture surgery Left 04    fusion  . Back surgery    . Eye surgery Bilateral 14    catarcts  . Total knee arthroplasty Left 06/09/2013    DR Thurston Hole  . Total knee arthroplasty Left 06/09/2013    Procedure: TOTAL KNEE ARTHROPLASTY;  Surgeon: Nilda Simmer, MD;  Location: [facility] OR;  Service: Orthopedics;  Laterality: Left;    Family History  Problem Relation Age of Onset  . Heart disease Mother   . Heart disease Father   . Heart attack Mother   . Heart attack Father   . Stroke Mother   .  Hypertension    . Clotting disorder Sister     sister died of a PE post op    History  Substance Use Topics  . Smoking status: Never Smoker   . Smokeless tobacco: Never Used  . Alcohol Use: No    OB History   Grav Para Term Preterm Abortions TAB SAB Ect Mult Living   3 3 3       3       Review of Systems  Respiratory: Negative for chest tightness and shortness of breath.   Cardiovascular: Negative for chest pain and leg swelling.  Gastrointestinal: Negative for nausea and vomiting.  Musculoskeletal: Positive for arthralgias, back pain and myalgias.  A complete 10 system review of systems was obtained and all systems are negative except as noted in the HPI and PMH.    Allergies  Review of patient's allergies indicates no known allergies.   Home Medications    Prior to Admission medications   Medication Sig Start Date End Date Taking? Authorizing Provider  amLODipine (NORVASC) 5 MG tablet Take 5 mg by mouth daily.    Historical Provider, MD  aspirin EC 325 MG EC tablet 1 tab a day for the next 30 days to prevent  blood clots 06/12/13   Kirstin J Shepperson, PA-C  bisacodyl (DULCOLAX) 5 MG EC tablet Take 2 tablets every night with dinner until bowel movement.  LAXITIVE.  Restart if two days since last bowel movement 06/12/13   Kirstin J Shepperson, PA-C  calcium carbonate 1250 MG capsule Take 1,200 mg by mouth 2 (two) times daily with a meal.    Historical Provider, MD  celecoxib (CELEBREX) 200 MG capsule Take 1 capsule (200 mg total) by mouth daily. 06/12/13   Kirstin J Shepperson, PA-C  Cholecalciferol (VITAMIN D) 2000 UNITS CAPS Take 2,000 Units by mouth daily.     Historical Provider, MD  citalopram (CELEXA) 20 MG tablet Take 20 mg by mouth daily.  01/24/13   Historical Provider, MD  clobetasol ointment (TEMOVATE) 0.05 % Apply 1 application topically 2 (two) times daily. 05/19/13   Elease HashimotoPatricia Rolen-Grubb, FNP  docusate sodium 100 MG CAPS 1 tab 2 times a day while on narcotics.   STOOL SOFTENER 06/12/13   Kirstin J Shepperson, PA-C  fish oil-omega-3 fatty acids 1000 MG capsule Take 2,400 mg by mouth 2 (two) times daily.    Historical Provider, MD  losartan-hydrochlorothiazide (HYZAAR) 50-12.5 MG per tablet Take 1 tablet by mouth daily.     Historical Provider, MD  mirabegron ER (MYRBETRIQ) 50 MG TB24 tablet Take 50 mg by mouth daily.    Historical Provider, MD  Multiple Vitamin (MULTIVITAMIN) tablet Take 1 tablet by mouth daily.    Historical Provider, MD  oxyCODONE (OXY IR/ROXICODONE) 5 MG immediate release tablet 1-2 tablets every 4-6 hrs as needed for pain 06/12/13   Kirstin J Shepperson, PA-C    Triage Vitals: BP 164/68  Pulse 76  Temp(Src) 97.9 F (36.6 C) (Oral)  Resp 16  Ht 5' 3.5" (1.613 m)  Wt 230 lb (104.327 kg)  BMI 40.10 kg/m2  SpO2 97%   Physical Exam  Nursing note and vitals reviewed. Constitutional: She appears well-developed and well-nourished. No distress.  HENT:  Head: Normocephalic and atraumatic.  Right Ear: External ear normal.  Left Ear: External ear normal.  Eyes: Conjunctivae are normal. Right eye exhibits no discharge. Left eye exhibits no discharge. No scleral icterus.  Neck: Neck supple. No tracheal deviation present.  Cardiovascular: Normal rate, regular rhythm and intact distal pulses.   Pulmonary/Chest: Effort normal and breath sounds normal. No stridor. No respiratory distress. She has no wheezes. She has no rales.  Abdominal: Soft. Bowel sounds are normal. She exhibits no distension. There is no tenderness. There is no rebound, no guarding and negative Murphy's sign.  Musculoskeletal: She exhibits no edema and no tenderness.       Left shoulder: Normal. She exhibits normal range of motion and no tenderness.  Neurological: She is alert. She has normal strength. No cranial nerve deficit (no facial droop, extraocular movements intact, no slurred speech) or sensory deficit. She exhibits normal muscle tone. She displays no seizure  activity. Coordination normal.  Skin: Skin is warm and dry. No rash noted.  Psychiatric: She has a normal mood and affect.    ED Course  Procedures (including critical care time)   DIAGNOSTIC STUDIES: Oxygen Saturation is 97% on RA, normal by my interpretation.     COORDINATION OF CARE: 7:58 PM- Pt advised of plan for treatment and pt agrees.   Labs Review Labs Reviewed  COMPREHENSIVE METABOLIC PANEL - Abnormal; Notable for the following:    Potassium 3.6 (*)    Glucose, Bld 118 (*)    GFR calc  non Af Amer 55 (*)    GFR calc Af Amer 64 (*)    All other components within normal limits  D-DIMER, QUANTITATIVE - Abnormal; Notable for the following:    D-Dimer, Quant 1.21 (*)    All other components within normal limits  APTT  CBC  PROTIME-INR  TROPONIN I     Imaging Review Dg Chest 2 View  10/29/2013   CLINICAL DATA:  Posterior left arm pain  EXAM: CHEST  2 VIEW  COMPARISON:  DG CHEST 2 VIEW dated 03/17/2013  FINDINGS: There is elevation of the right diaphragm. There is no focal parenchymal opacity, pleural effusion, or pneumothorax. The heart and mediastinal contours are unremarkable.  The osseous structures are unremarkable.  IMPRESSION: No active cardiopulmonary disease.   Electronically Signed   By: Elige KoHetal  Patel   On: 10/29/2013 20:35   Ct Angio Chest W/cm &/or Wo Cm  10/29/2013   CLINICAL DATA:  Left-sided chest pain, elevated D-dimer  EXAM: CT ANGIOGRAPHY CHEST WITH CONTRAST  TECHNIQUE: Multidetector CT imaging of the chest was performed using the standard protocol during bolus administration of intravenous contrast. Multiplanar CT image reconstructions and MIPs were obtained to evaluate the vascular anatomy.  CONTRAST:  80mL OMNIPAQUE IOHEXOL 350 MG/ML SOLN  COMPARISON:  None.  FINDINGS: There is adequate opacification of the pulmonary arteries. There is no pulmonary embolus. The main pulmonary artery, right main pulmonary artery and left main pulmonary arteries are normal in  size. The heart size is normal. There is no pericardial effusion. There is mild coronary artery atherosclerosis in the circumflex.  There is right basilar atelectasis. There is no focal consolidation. No pleural effusion or pneumothorax.  There is no axillary, hilar, or mediastinal adenopathy.  There is no lytic or blastic osseous lesion.  The visualized portions of the upper abdomen are unremarkable.  Review of the MIP images confirms the above findings.  IMPRESSION: 1. No evidence of pulmonary embolus.   Electronically Signed   By: Elige KoHetal  Patel   On: 10/29/2013 21:59   Dg Shoulder Left  10/29/2013   CLINICAL DATA:  Posterior left arm pain  EXAM: LEFT SHOULDER - 2+ VIEW  COMPARISON:  None.  FINDINGS: There is no evidence of fracture or dislocation. There is no evidence of arthropathy or other focal bone abnormality. Soft tissues are unremarkable.  IMPRESSION: No acute osseous injury of the left shoulder.   Electronically Signed   By: Elige KoHetal  Patel   On: 10/29/2013 20:36      EKG Interpretation   Date/Time:  Wednesday Oct 29 2013 19:44:08 EDT Ventricular Rate:  76 PR Interval:  164 QRS Duration: 92 QT Interval:  422 QTC Calculation: 474 R Axis:   11 Text Interpretation:  Normal sinus rhythm Normal ECG No significant change  since last tracing Confirmed by Champayne Kocian  MD-J, Herta Hink ([id number]) on 10/29/2013  7:55:51 PM      MDM   Final diagnoses:  Shoulder pain    No sign of dissection or PE on CT scan.  EKG normal, symptoms atypical for ACS.  Doubt cardiac etiology.  Likely musculoskeletal  I personally performed the services described in this documentation, which was scribed in my presence.  The recorded information has been reviewed and is accurate.    Celene KrasJon R Cordale Manera, MD 10/29/13 [id number]

## 2013-10-29 NOTE — ED Notes (Signed)
Patient transported to CT 

## 2013-10-29 NOTE — Discharge Instructions (Signed)
Rotator Cuff Injury Rotator cuff injury is any type of injury to the set of muscles and tendons that make up the stabilizing unit of your shoulder. This unit holds the ball of your upper arm bone (humerus) in the socket of your shoulder blade (scapula).  CAUSES Injuries to your rotator cuff most commonly come from sports or activities that cause your arm to be moved repeatedly over your head. Examples of this include throwing, weight lifting, swimming, or racquet sports. Long lasting (chronic) irritation of your rotator cuff can cause soreness and swelling (inflammation), bursitis, and eventual damage to your tendons, such as a tear (rupture). SIGNS AND SYMPTOMS Acute rotator cuff tear:  Sudden tearing sensation followed by severe pain shooting from your upper shoulder down your arm toward your elbow.  Decreased range of motion of your shoulder because of pain and muscle spasm.  Severe pain.  Inability to raise your arm out to the side because of pain and loss of muscle power (large tears). Chronic rotator cuff tear:  Pain that usually is worse at night and may interfere with sleep.  Gradual weakness and decreased shoulder motion as the pain worsens.  Decreased range of motion. Rotator cuff tendinitis:  Deep ache in your shoulder and the outside upper arm over your shoulder.  Pain that comes on gradually and becomes worse when lifting your arm to the side or turning it inward. DIAGNOSIS Rotator cuff injury is diagnosed through a medical history, physical exam, and imaging exam. The medical history helps determine the type of rotator cuff injury. Your health care provider will look at your injured shoulder, feel the injured area, and ask you to move your shoulder in different positions. X-ray exams typically are done to rule out other causes of shoulder pain, such as fractures. MRI is the exam of choice for the most severe shoulder injuries because the images show muscles and tendons.    TREATMENT  Chronic tear:  Medicine for pain, such as acetaminophen or ibuprofen.  Physical therapy and range-of-motion exercises may be helpful in maintaining shoulder function and strength.  Steroid injections into your shoulder joint.  Surgical repair of the rotator cuff if the injury does not heal with noninvasive treatment. Acute tear:  Anti-inflammatory medicines such as ibuprofen and naproxen to help reduce pain and swelling.  A sling to help support your arm and rest your rotator cuff muscles. Long-term use of a sling is not advised. It may cause significant stiffening of the shoulder joint.  Surgery may be considered within a few weeks, especially in younger, active people, to return the shoulder to full function.  Indications for surgical treatment include the following:  Age younger than 60 years.  Rotator cuff tears that are complete.  Physical therapy, rest, and anti-inflammatory medicines have been used for 6 8 weeks, with no improvement.  Employment or sporting activity that requires constant shoulder use. Tendinitis:  Anti-inflammatory medicines such as ibuprofen and naproxen to help reduce pain and swelling.  A sling to help support your arm and rest your rotator cuff muscles. Long-term use of a sling is not advised. It may cause significant stiffening of the shoulder joint.  Severe tendinitis may require:  Steroid injections into your shoulder joint.  Physical therapy.  Surgery. HOME CARE INSTRUCTIONS   Apply ice to your injury:  Put ice in a plastic bag.  Place a towel between your skin and the bag.  Leave the ice on for 20 minutes, 2 3 times a day.    If you have a shoulder immobilizer (sling and straps), wear it until told otherwise by your health care provider.  You may want to sleep on several pillows or in a recliner at night to lessen swelling and pain.  Only take over-the-counter or prescription medicines for pain, discomfort, or fever as  directed by your health care provider.  Do simple hand squeezing exercises with a soft rubber ball to decrease hand swelling. SEEK MEDICAL CARE IF:   Your shoulder pain increases, or new pain or numbness develops in your arm, hand, or fingers.  Your hand or fingers are colder than your other hand. SEEK IMMEDIATE MEDICAL CARE IF:   Your arm, hand, or fingers are numb or tingling.  Your arm, hand, or fingers are increasingly swollen and painful, or they turn white or blue. MAKE SURE YOU:  Understand these instructions.  Will watch your condition.  Will get help right away if you are not doing well or get worse. Document Released: 06/09/2000 Document Revised: 04/02/2013 Document Reviewed: 01/22/2013 [employer] Patient Information 2014 [employer].  

## 2013-10-29 NOTE — ED Notes (Signed)
Patient transported to X-ray 

## 2013-10-29 NOTE — ED Notes (Signed)
Sudden pain in left shoulder, upper back and back of left arm x 1 hour. Sts she had a massage yesterday.

## 2013-10-29 NOTE — ED Notes (Signed)
Pt returned from xray c/o dizziness while standing in xray, pt a & o x 3, denies dizziness at this time, vss

## 2013-12-03 ENCOUNTER — Ambulatory Visit (INDEPENDENT_AMBULATORY_CARE_PROVIDER_SITE_OTHER): Payer: MEDICARE | Admitting: Podiatry

## 2013-12-03 ENCOUNTER — Encounter: Payer: Self-pay | Admitting: Podiatry

## 2013-12-03 VITALS — BP 138/84 | HR 68 | Ht 64.0 in | Wt 230.0 lb

## 2013-12-03 DIAGNOSIS — M79609 Pain in unspecified limb: Secondary | ICD-10-CM

## 2013-12-03 DIAGNOSIS — M722 Plantar fascial fibromatosis: Secondary | ICD-10-CM

## 2013-12-03 DIAGNOSIS — M21969 Unspecified acquired deformity of unspecified lower leg: Secondary | ICD-10-CM

## 2013-12-03 NOTE — Progress Notes (Signed)
Subjective: 73 year old female presents complaining of right heel pain plantar ball x 2 weeks. Has had the same problem 10 years ago. Has had shot years ago. Stated that she was diagnosed for Neuropathy on her feet more than a year ago.   Review of Systems - General ROS: negative for - chills, fatigue, fever, night sweats, sleep disturbance, weight gain or weight loss Ophthalmic ROS: Cataract surgery done 2014. ENT ROS: negative Allergy and Immunology ROS: negative Breast ROS: negative for breast lumps Respiratory ROS: no cough, shortness of breath, or wheezing Cardiovascular ROS: no chest pain or dyspnea on exertion Gastrointestinal ROS: no abdominal pain, change in bowel habits, or black or bloody stools Genito-Urinary ROS: Over active bladder. Musculoskeletal ROS: Left knee surgery in December 2014.  Neurological ROS: no TIA or stroke symptoms Dermatological ROS: negative.  Objective:  Dermatologic: No open lesions or abnormal findings.  Neurolgic: Decreased response to Monofilament sensory testing. Normal response to vibratory and DTR testing bilateral. Vascular: All pedal pulses are palpable. No edema or erythema noted. Orthopedic: Hallux valgus with bunion R>L. Weak and hypermobile first ray right.  Assessment: Plantar fasciitis right. Metatarsal deformity right. Pain in right heel.  Plan: Reviewed findings and available options. Right plantar medial tuberosity area Injected with mixture of 4 mg Dexamethasone, 4 mg Triamcinolone, and 1 cc of 0.5% Marcaine plain. Patient tolerated well without difficulty.  Night Splint dispensed with instruction. Return in 2 weeks.  May benefit from Orthotic shoe inserts.

## 2013-12-03 NOTE — Patient Instructions (Signed)
Seen for left heel pain. Cortisone injection given to right heel. Night Splint dispensed. May benefit from Orthotic shoe inserts. Return in 2 weeks.

## 2013-12-17 ENCOUNTER — Encounter: Payer: Self-pay | Admitting: Podiatry

## 2013-12-17 ENCOUNTER — Ambulatory Visit (INDEPENDENT_AMBULATORY_CARE_PROVIDER_SITE_OTHER): Payer: MEDICARE | Admitting: Podiatry

## 2013-12-17 DIAGNOSIS — M21961 Unspecified acquired deformity of right lower leg: Secondary | ICD-10-CM

## 2013-12-17 DIAGNOSIS — M722 Plantar fascial fibromatosis: Secondary | ICD-10-CM

## 2013-12-17 DIAGNOSIS — M216X1 Other acquired deformities of right foot: Secondary | ICD-10-CM

## 2013-12-17 DIAGNOSIS — M216X9 Other acquired deformities of unspecified foot: Secondary | ICD-10-CM

## 2013-12-17 DIAGNOSIS — M21969 Unspecified acquired deformity of unspecified lower leg: Secondary | ICD-10-CM

## 2013-12-17 NOTE — Patient Instructions (Signed)
Injection repeated for unresolved right heel pain.  Will continue to try with Night Splint and stretch exercise, and use of Orthotics in lace up shoes. Return in 2 weeks.

## 2013-12-17 NOTE — Progress Notes (Signed)
Subjective: 73 year old presents for follow up on right heel pain. Injection and Night Splint wasn't helping. Had problem keeping Night Splint on.   Objective: Pain in right plantar heel. Hallux valgus with bunion right. Pes planus. Tight Achilles tendon bilateral.   Assessment: Unresolved plantar fasciitis right. Pes Planus right.  HAV with bunion right. Ankle equinus bilateral.  Plan:  Reviewed findings. Discussed Achilles tendon stretch exercise. Patient is to wear Orthotics with lace up shoes. Injection repeated on right heel, injection consisted of mixture of 4 mg Dexamethasone, 4 mg Triamcinolone, and 1 cc of 0.5% Marcaine plain. Patient tolerated well without difficulty.  Dispensed extra straps for Night Splint. If still having difficulty, will try different types of Night Splint.

## 2013-12-31 ENCOUNTER — Encounter: Payer: Self-pay | Admitting: Podiatry

## 2013-12-31 ENCOUNTER — Ambulatory Visit (INDEPENDENT_AMBULATORY_CARE_PROVIDER_SITE_OTHER): Payer: MEDICARE | Admitting: Podiatry

## 2013-12-31 VITALS — BP 126/77 | HR 65

## 2013-12-31 DIAGNOSIS — M79609 Pain in unspecified limb: Secondary | ICD-10-CM

## 2013-12-31 DIAGNOSIS — M216X1 Other acquired deformities of right foot: Secondary | ICD-10-CM

## 2013-12-31 DIAGNOSIS — M216X9 Other acquired deformities of unspecified foot: Secondary | ICD-10-CM

## 2013-12-31 DIAGNOSIS — M722 Plantar fascial fibromatosis: Secondary | ICD-10-CM

## 2013-12-31 NOTE — Patient Instructions (Signed)
Made good improvement on right heel pain with injection, night splint, stretch exercise and orthotics. Continue to do stretch exercise, use orthotics, and night splint as needed. May benefit from Neuremedy for neuropathy, tingling at night.

## 2013-12-31 NOTE — Progress Notes (Signed)
Subjective: 73 year old female presents for follow up on right heel pain.  Stated that she has made improvement with injection, night splint, exercise, and orthotics(OTC). Stated that she has Neuropathy and wants to try Neuremedy that was on display.   Objective: No new changes. Right Achilles tendon still tight.   Assessment: Improved plantar fasciitis right foot.  Ankle equinus right.   Plan:  Continue stretch exercise. Use night splint as needed.  Stay in orthotics.  May try Neuremedy for Neuropathy.  Return as needed.

## 2014-02-20 ENCOUNTER — Ambulatory Visit: Payer: Medicare Other | Admitting: Certified Nurse Midwife

## 2014-02-24 ENCOUNTER — Encounter: Payer: Self-pay | Admitting: Certified Nurse Midwife

## 2014-02-24 ENCOUNTER — Ambulatory Visit (INDEPENDENT_AMBULATORY_CARE_PROVIDER_SITE_OTHER): Payer: MEDICARE | Admitting: Certified Nurse Midwife

## 2014-02-24 VITALS — BP 120/74 | HR 70 | Resp 16 | Ht 63.25 in | Wt 238.0 lb

## 2014-02-24 DIAGNOSIS — B3731 Acute candidiasis of vulva and vagina: Secondary | ICD-10-CM

## 2014-02-24 DIAGNOSIS — B373 Candidiasis of vulva and vagina: Secondary | ICD-10-CM

## 2014-02-24 DIAGNOSIS — Z01419 Encounter for gynecological examination (general) (routine) without abnormal findings: Secondary | ICD-10-CM

## 2014-02-24 MED ORDER — NYSTATIN 100000 UNIT/GM EX POWD: Freq: Three times a day (TID) | CUTANEOUS | Status: DC

## 2014-02-24 NOTE — Progress Notes (Signed)
73 y.o. [id number] Divorced Caucasian Fe here for annual exam. Menopausal no vaginal bleeding or vaginal dryness. Patient uses cream vaginally for relief of dryness. Sees PCP aex/medication management for hypertension, anxiety/ labs. All stable medication with PCP. Patient has had 4 UTI's with Urology management since 12/14. Has appointment with Urology regarding continual leakage. Currently on antibiotics for UTI. Complaining of vaginal itching external, from being wet constantly. Denies new personal products. Denies burning or extra vaginal discharge.. Not sexually active.  Patient's last menstrual period was 06/26/1996.          Sexually active: No.  The current method of family planning is post menopausal status.    Exercising: Yes.    water aerobics Smoker:  no  Health Maintenance: Pap: 12-03-09 neg MMG:  08-26-13 density category b, birads category 1:neg Colonoscopy: 2007, normal IFOB per PCP BMD:   2010 pcp manages TDaP:  6/14 Labs: none Self breast exam: done occ   reports that she has never smoked. She has never used smokeless tobacco. She reports that she does not drink alcohol or use illicit drugs.  Past Medical History  Diagnosis Date  . HTN (hypertension)   . Obesity   . Abnormal uterine bleeding (AUB)   . SUI (stress urinary incontinence, female)   . Left knee DJD   . Depression   . Neuropathy     Past Surgical History  Procedure Laterality Date  . Appendectomy    . Knee arthroscopy Left 12/2011  . Tubal ligation Bilateral   . Lumbar disc surgery  12/94  . Ankle fracture surgery Left 04    fusion  . Back surgery    . Eye surgery Bilateral 14    catarcts  . Total knee arthroplasty Left 06/09/2013    DR Thurston Hole  . Total knee arthroplasty Left 06/09/2013    Procedure: TOTAL KNEE ARTHROPLASTY;  Surgeon: Nilda Simmer, MD;  Location: [facility] OR;  Service: Orthopedics;  Laterality: Left;    Current Outpatient Prescriptions  Medication Sig Dispense Refill  . amLODipine  (NORVASC) 5 MG tablet Take 5 mg by mouth daily.      Marland Kitchen aspirin EC 325 MG EC tablet 1 tab a day for the next 30 days to prevent blood clots  30 tablet  0  . calcium carbonate 1250 MG capsule Take 1,200 mg by mouth 2 (two) times daily with a meal.      . cephALEXin (KEFLEX) 500 MG capsule Take 500 mg by mouth 3 (three) times daily.      . Cholecalciferol (VITAMIN D) 2000 UNITS CAPS Take 2,000 Units by mouth daily.       . citalopram (CELEXA) 20 MG tablet Take 20 mg by mouth daily.       . clobetasol ointment (TEMOVATE) 0.05 % Apply 1 application topically 2 (two) times daily.      . fish oil-omega-3 fatty acids 1000 MG capsule Take 2,400 mg by mouth 2 (two) times daily.      Marland Kitchen losartan-hydrochlorothiazide (HYZAAR) 50-12.5 MG per tablet Take 1 tablet by mouth daily.       . metoprolol succinate (TOPROL-XL) 25 MG 24 hr tablet daily.      . Multiple Vitamin (MULTIVITAMIN) tablet Take 1 tablet by mouth daily.       No current facility-administered medications for this visit.    Family History  Problem Relation Age of Onset  . Heart disease Mother   . Heart disease Father   . Heart attack Mother   .  Heart attack Father   . Stroke Mother   . Hypertension    . Clotting disorder Sister     sister died of a PE post op    ROS:  Pertinent items are noted in HPI.  Otherwise, a comprehensive ROS was negative.  Exam:   BP 120/74  Pulse 70  Resp 16  Ht 5' 3.25" (1.607 m)  Wt 238 lb (107.956 kg)  BMI 41.80 kg/m2  LMP 06/26/1996 Height: 5' 3.25" (160.7 cm)  Ht Readings from Last 3 Encounters:  02/24/14 5' 3.25" (1.607 m)  12/03/13  (1.626 m)  10/29/13 5' 3.5" (1.613 m)    General appearance: alert, cooperative and appears stated age Head: Normocephalic, without obvious abnormality, atraumatic Neck: no adenopathy, supple, symmetrical, trachea midline and thyroid normal to inspection and palpation Lungs: clear to auscultation bilaterally Breasts: normal appearance, no masses or  tenderness, No nipple retraction or dimpling, No nipple discharge or bleeding, No axillary or supraclavicular adenopathy Heart: regular rate and rhythm Abdomen: soft, non-tender; no masses,  no organomegaly Extremities: extremities normal, atraumatic, no cyanosis or edema Skin: Skin color, texture, turgor normal. No rashes or lesions Lymph nodes: Cervical, supraclavicular, and axillary nodes normal. No abnormal inguinal nodes palpated Neurologic: Grossly normal   Pelvic: External genitalia:  no lesions, red with scaling and white exudate Wet prep taken, no sign of LS flare              Urethra:  normal appearing urethra with no masses, tenderness or lesions              Bartholin's and Skene's: normal                 Vagina: normal appearing vagina with normal color and discharge, no lesions, wet prep taken, ph 4.0, grade 1 cystocele,no rectocele noted              Cervix: normal, non tender, no lesions noted              Pap taken: No. Bimanual Exam:  Uterus:  normal size, contour, position, consistency, mobility, non-tender and mid position              Adnexa: normal adnexa and no mass, fullness, tenderness               Rectovaginal: Confirms               Anus:  normal sphincter tone, no lesions  Wet prep: Positive yeast external only  A:  Well Woman with normal exam  Menopausal  No HRT  Yeast vulvitis  Urinary incontinence with occasional bowel incontinence, has Urology follow up appointment to discuss her status with this  History of Lichen Sclerous  P:   Reviewed health and wellness pertinent to exam  Aware of need to evaluate if vaginal bleeding.  Reviewed findings and etiology. Discussed with the incontinence she is staying moist a lot which encourages yeast growth. Encouraged to change underwear frequently to help with this. Also discussed use of skin protective like Olive Oil or Vaseline external only.  Rx.Nystatin powder see order with instructions to apply to skin 3 x  daily to see if this will control symptoms  Keep appointment as indicated  Pap smear not taken today  counseled on breast self exam, mammography screening, feminine hygiene, adequate intake of calcium and vitamin D, diet and exercise return annually or prn  An After Visit Summary was printed and given to the patient.

## 2014-02-24 NOTE — Patient Instructions (Signed)
EXERCISE AND DIET:  We recommended that you start or continue a regular exercise program for good health. Regular exercise means any activity that makes your heart beat faster and makes you sweat.  We recommend exercising at least 30 minutes per day at least 3 days a week, preferably 4 or 5.  We also recommend a diet low in fat and sugar.  Inactivity, poor dietary choices and obesity can cause diabetes, heart attack, stroke, and kidney damage, among others.    ALCOHOL AND SMOKING:  Women should limit their alcohol intake to no more than 7 drinks/beers/glasses of wine (combined, not each!) per week. Moderation of alcohol intake to this level decreases your risk of breast cancer and liver damage. And of course, no recreational drugs are part of a healthy lifestyle.  And absolutely no smoking or even second hand smoke. Most people know smoking can cause heart and lung diseases, but did you know it also contributes to weakening of your bones? Aging of your skin?  Yellowing of your teeth and nails?  CALCIUM AND VITAMIN D:  Adequate intake of calcium and Vitamin D are recommended.  The recommendations for exact amounts of these supplements seem to change often, but generally speaking 600 mg of calcium (either carbonate or citrate) and 800 units of Vitamin D per day seems prudent. Certain women may benefit from higher intake of Vitamin D.  If you are among these women, your doctor will have told you during your visit.    PAP SMEARS:  Pap smears, to check for cervical cancer or precancers,  have traditionally been done yearly, although recent scientific advances have shown that most women can have pap smears less often.  However, every woman still should have a physical exam from her gynecologist every year. It will include a breast check, inspection of the vulva and vagina to check for abnormal growths or skin changes, a visual exam of the cervix, and then an exam to evaluate the size and shape of the uterus and  ovaries.  And after 72 years of age, a rectal exam is indicated to check for rectal cancers. We will also provide age appropriate advice regarding health maintenance, like when you should have certain vaccines, screening for sexually transmitted diseases, bone density testing, colonoscopy, mammograms, etc.   MAMMOGRAMS:  All women over 40 years old should have a yearly mammogram. Many facilities now offer a "3D" mammogram, which may cost around $50 extra out of pocket. If possible,  we recommend you accept the option to have the 3D mammogram performed.  It both reduces the number of women who will be called back for extra views which then turn out to be normal, and it is better than the routine mammogram at detecting truly abnormal areas.    COLONOSCOPY:  Colonoscopy to screen for colon cancer is recommended for all women at age 50.  We know, you hate the idea of the prep.  We agree, BUT, having colon cancer and not knowing it is worse!!  Colon cancer so often starts as a polyp that can be seen and removed at colonscopy, which can quite literally save your life!  And if your first colonoscopy is normal and you have no family history of colon cancer, most women don't have to have it again for 10 years.  Once every ten years, you can do something that may end up saving your life, right?  We will be happy to help you get it scheduled when you are ready.    Be sure to check your insurance coverage so you understand how much it will cost.  It may be covered as a preventative service at no cost, but you should check your particular policy.     Candida Infection A Candida infection (also called yeast, fungus, and Monilia infection) is an overgrowth of yeast that can occur anywhere on the body. A yeast infection commonly occurs in warm, moist body areas. Usually, the infection remains localized but can spread to become a systemic infection. A yeast infection may be a sign of a more severe disease such as diabetes,  leukemia, or AIDS. A yeast infection can occur in both men and women. In women, Candida vaginitis is a vaginal infection. It is one of the most common causes of vaginitis. Men usually do not have symptoms or know they have an infection until other problems develop. Men may find out they have a yeast infection because their sex partner has a yeast infection. Uncircumcised men are more likely to get a yeast infection than circumcised men. This is because the uncircumcised glans is not exposed to air and does not remain as dry as that of a circumcised glans. Older adults may develop yeast infections around dentures. CAUSES  Women  Antibiotics.  Steroid medication taken for a long time.  Being overweight (obese).  Diabetes.  Poor immune condition.  Certain serious medical conditions.  Immune suppressive medications for organ transplant patients.  Chemotherapy.  Pregnancy.  Menstruation.  Stress and fatigue.  Intravenous drug use.  Oral contraceptives.  Wearing tight-fitting clothes in the crotch area.  Catching it from a sex partner who has a yeast infection.  Spermicide.  Intravenous, urinary, or other catheters. Men  Catching it from a sex partner who has a yeast infection.  Having oral or anal sex with a person who has the infection.  Spermicide.  Diabetes.  Antibiotics.  Poor immune system.  Medications that suppress the immune system.  Intravenous drug use.  Intravenous, urinary, or other catheters. SYMPTOMS  Women  Thick, white vaginal discharge.  Vaginal itching.  Redness and swelling in and around the vagina.  Irritation of the lips of the vagina and perineum.  Blisters on the vaginal lips and perineum.  Painful sexual intercourse.  Low blood sugar (hypoglycemia).  Painful urination.  Bladder infections.  Intestinal problems such as constipation, indigestion, bad breath, bloating, increase in gas, diarrhea, or loose stools. Men  Men  may develop intestinal problems such as constipation, indigestion, bad breath, bloating, increase in gas, diarrhea, or loose stools.  Dry, cracked skin on the penis with itching or discomfort.  Jock itch.  Dry, flaky skin.  Athlete's foot.  Hypoglycemia. DIAGNOSIS  Women  A history and an exam are performed.  The discharge may be examined under a microscope.  A culture may be taken of the discharge. Men  A history and an exam are performed.  Any discharge from the penis or areas of cracked skin will be looked at under the microscope and cultured.  Stool samples may be cultured. TREATMENT  Women  Vaginal antifungal suppositories and creams.  Medicated creams to decrease irritation and itching on the outside of the vagina.  Warm compresses to the perineal area to decrease swelling and discomfort.  Oral antifungal medications.  Medicated vaginal suppositories or cream for repeated or recurrent infections.  Wash and dry the irritation areas before applying the cream.  Eating yogurt with Lactobacillus may help with prevention and treatment.  Sometimes painting the vagina with   gentian violet solution may help if creams and suppositories do not work. Men  Antifungal creams and oral antifungal medications.  Sometimes treatment must continue for 30 days after the symptoms go away to prevent recurrence. HOME CARE INSTRUCTIONS  Women  Use cotton underwear and avoid tight-fitting clothing.  Avoid colored, scented toilet paper and deodorant tampons or pads.  Do not douche.  Keep your diabetes under control.  Finish all the prescribed medications.  Keep your skin clean and dry.  Consume milk or yogurt with Lactobacillus-active culture regularly. If you get frequent yeast infections and think that is what the infection is, there are over-the-counter medications that you can get. If the infection does not show healing in 3 days, talk to your caregiver.  Tell your sex  partner you have a yeast infection. Your partner may need treatment also, especially if your infection does not clear up or recurs. Men  Keep your skin clean and dry.  Keep your diabetes under control.  Finish all prescribed medications.  Tell your sex partner that you have a yeast infection so he or she can be treated if necessary. SEEK MEDICAL CARE IF:   Your symptoms do not clear up or worsen in one week after treatment.  You have an oral temperature above 102 F (38.9 C).  You have trouble swallowing or eating for a prolonged time.  You develop blisters on and around your vagina.  You develop vaginal bleeding and it is not your menstrual period.  You develop abdominal pain.  You develop intestinal problems as mentioned above.  You get weak or light-headed.  You have painful or increased urination.  You have pain during sexual intercourse. MAKE SURE YOU:   Understand these instructions.  Will watch your condition.  Will get help right away if you are not doing well or get worse. Document Released: 07/20/2004 Document Revised: 10/27/2013 Document Reviewed: 11/01/2009 [employer] Patient Information 2015 [employer]. This information is not intended to replace advice given to you by your health care provider. Make sure you discuss any questions you have with your health care provider.  

## 2014-02-25 ENCOUNTER — Telehealth: Payer: Self-pay | Admitting: Certified Nurse Midwife

## 2014-02-25 NOTE — Telephone Encounter (Signed)
Called pharmacy and R[facility] Coombs states they have Rx on file but they don't have in stock. They have to order Rx.  LM for pt w/ info. Pt can call pharmacy and have it transfer to another pharmacy or just wait till they order it. Call us if she has any questions.

## 2014-02-25 NOTE — Telephone Encounter (Signed)
Ptwas seen yesterday and her prescription was not sent to the pharmacy she states it is for a powder and does not know the name. She is using [facility] at [facility]

## 2014-02-25 NOTE — Progress Notes (Signed)
Reviewed personally.  M. Suzanne Annett Boxwell, MD.  

## 2014-02-26 DIAGNOSIS — IMO0002 Reserved for concepts with insufficient information to code with codable children: Secondary | ICD-10-CM | POA: Insufficient documentation

## 2014-02-26 DIAGNOSIS — N3946 Mixed incontinence: Secondary | ICD-10-CM | POA: Insufficient documentation

## 2014-02-26 DIAGNOSIS — N390 Urinary tract infection, site not specified: Secondary | ICD-10-CM | POA: Insufficient documentation

## 2014-04-10 ENCOUNTER — Other Ambulatory Visit: Payer: Self-pay

## 2014-04-27 ENCOUNTER — Encounter: Payer: Self-pay | Admitting: Certified Nurse Midwife

## 2014-07-27 ENCOUNTER — Other Ambulatory Visit: Payer: Self-pay

## 2014-07-27 DIAGNOSIS — Z1231 Encounter for screening mammogram for malignant neoplasm of breast: Secondary | ICD-10-CM

## 2014-08-28 ENCOUNTER — Ambulatory Visit
Admission: RE | Admit: 2014-08-28 | Discharge: 2014-08-28 | Disposition: A | Discharge status: Home / Self Care | Payer: Medicare PPO | Source: Ambulatory Visit

## 2014-08-28 DIAGNOSIS — Z1231 Encounter for screening mammogram for malignant neoplasm of breast: Secondary | ICD-10-CM

## 2014-12-21 ENCOUNTER — Other Ambulatory Visit: Payer: Self-pay

## 2015-02-26 ENCOUNTER — Ambulatory Visit (INDEPENDENT_AMBULATORY_CARE_PROVIDER_SITE_OTHER): Payer: Medicare PPO | Admitting: Certified Nurse Midwife

## 2015-02-26 ENCOUNTER — Encounter: Payer: Self-pay | Admitting: Certified Nurse Midwife

## 2015-02-26 VITALS — BP 112/64 | HR 70 | Resp 20 | Ht 62.75 in | Wt 229.0 lb

## 2015-02-26 DIAGNOSIS — L900 Lichen sclerosus et atrophicus: Secondary | ICD-10-CM

## 2015-02-26 DIAGNOSIS — Z01419 Encounter for gynecological examination (general) (routine) without abnormal findings: Secondary | ICD-10-CM

## 2015-02-26 DIAGNOSIS — B372 Candidiasis of skin and nail: Secondary | ICD-10-CM | POA: Diagnosis not present

## 2015-02-26 MED ORDER — CLOBETASOL PROPIONATE 0.05 % EX CREA: 1.0000 "application " | TOPICAL_CREAM | Freq: Two times a day (BID) | CUTANEOUS | Status: DC

## 2015-02-26 MED ORDER — NYSTATIN 100000 UNIT/GM EX POWD: Freq: Three times a day (TID) | CUTANEOUS | Status: DC

## 2015-02-26 NOTE — Progress Notes (Signed)
Reviewed personally.  M. Suzanne Mackenzie Groom, MD.  

## 2015-02-26 NOTE — Patient Instructions (Signed)
EXERCISE AND DIET:  We recommended that you start or continue a regular exercise program for good health. Regular exercise means any activity that makes your heart beat faster and makes you sweat.  We recommend exercising at least 30 minutes per day at least 3 days a week, preferably 4 or 5.  We also recommend a diet low in fat and sugar.  Inactivity, poor dietary choices and obesity can cause diabetes, heart attack, stroke, and kidney damage, among others.    ALCOHOL AND SMOKING:  Women should limit their alcohol intake to no more than 7 drinks/beers/glasses of wine (combined, not each!) per week. Moderation of alcohol intake to this level decreases your risk of breast cancer and liver damage. And of course, no recreational drugs are part of a healthy lifestyle.  And absolutely no smoking or even second hand smoke. Most people know smoking can cause heart and lung diseases, but did you know it also contributes to weakening of your bones? Aging of your skin?  Yellowing of your teeth and nails?  CALCIUM AND VITAMIN D:  Adequate intake of calcium and Vitamin D are recommended.  The recommendations for exact amounts of these supplements seem to change often, but generally speaking 600 mg of calcium (either carbonate or citrate) and 800 units of Vitamin D per day seems prudent. Certain women may benefit from higher intake of Vitamin D.  If you are among these women, your doctor will have told you during your visit.    PAP SMEARS:  Pap smears, to check for cervical cancer or precancers,  have traditionally been done yearly, although recent scientific advances have shown that most women can have pap smears less often.  However, every woman still should have a physical exam from her gynecologist every year. It will include a breast check, inspection of the vulva and vagina to check for abnormal growths or skin changes, a visual exam of the cervix, and then an exam to evaluate the size and shape of the uterus and  ovaries.  And after 73 years of age, a rectal exam is indicated to check for rectal cancers. We will also provide age appropriate advice regarding health maintenance, like when you should have certain vaccines, screening for sexually transmitted diseases, bone density testing, colonoscopy, mammograms, etc.   MAMMOGRAMS:  All women over 40 years old should have a yearly mammogram. Many facilities now offer a "3D" mammogram, which may cost around $50 extra out of pocket. If possible,  we recommend you accept the option to have the 3D mammogram performed.  It both reduces the number of women who will be called back for extra views which then turn out to be normal, and it is better than the routine mammogram at detecting truly abnormal areas.    COLONOSCOPY:  Colonoscopy to screen for colon cancer is recommended for all women at age 50.  We know, you hate the idea of the prep.  We agree, BUT, having colon cancer and not knowing it is worse!!  Colon cancer so often starts as a polyp that can be seen and removed at colonscopy, which can quite literally save your life!  And if your first colonoscopy is normal and you have no family history of colon cancer, most women don't have to have it again for 10 years.  Once every ten years, you can do something that may end up saving your life, right?  We will be happy to help you get it scheduled when you are ready.    Be sure to check your insurance coverage so you understand how much it will cost.  It may be covered as a preventative service at no cost, but you should check your particular policy.     Lichen Sclerosus Lichen sclerosus is a skin problem. It can happen on any part of the body. It happens most often in the anal or genital areas. Girls and women get lichen sclerosus more often than boys and men. The cause is not known. This skin problem is not passed from one person to another. HOME CARE  Only take medicines as told by your doctor.  Keep the vagina as  clean and dry as you can. GET HELP RIGHT AWAY IF: Your pain, puffiness (swelling), or redness gets worse. MAKE SURE YOU:  Understand these instructions.  Will watch your condition.  Will get help right away if you are not doing well or get worse. Document Released: 05/25/2008 Document Revised: 09/04/2011 Document Reviewed: 10/14/2010 [facility] Patient Information 2015 [place], [place]. This information is not intended to replace advice given to you by your health care provider. Make sure you discuss any questions you have with your health care provider.

## 2015-02-26 NOTE — Progress Notes (Signed)
74 y.o. G27P3003 Divorced  Caucasian Fe here fo8r annual exam. Menopausal no vaginal bleeding and occasional vaginal dryness. Sees PCP for hypertension/depression/cholesterol/ GERD management/labs and aex. Sees PCP every 6 months. Saw urology for leaking and had bladder lift and did not work. Taking Vesicare with good response. Still doing water aerobics and is having vulvar itching again. Feels like another LS flare, is out of her medication for treatment. Mycostatin powder working well for groin yeast, needs Rx. Has been on weight loss and has lost 9 pounds and continues to work on. No other health issues today.  Patient's last menstrual period was 06/26/1996.          Sexually active: No.  The current method of family planning is tubal ligation.    Exercising: Yes.    water aerobics Smoker:  no  Health Maintenance: Pap:  12-03-09 neg MMG:  08-28-14 category b density, birads 1:neg Colonoscopy:  2007  BMD:   2016 pcp manages TDaP:  2014 Labs: pcp Self breast exam: done occ   reports that she has never smoked. She has never used smokeless tobacco. She reports that she does not drink alcohol or use illicit drugs.  Past Medical History  Diagnosis Date  . HTN (hypertension)   . Obesity   . Abnormal uterine bleeding (AUB)   . SUI (stress urinary incontinence, female)   . Left knee DJD   . Depression   . Neuropathy     Past Surgical History  Procedure Laterality Date  . Appendectomy    . Knee arthroscopy Left 12/2011  . Tubal ligation Bilateral   . Lumbar disc surgery  12/94  . Ankle fracture surgery Left 04    fusion  . Back surgery    . Eye surgery Bilateral 14    catarcts  . Total knee arthroplasty Left 06/09/2013    DR Thurston Hole  . Total knee arthroplasty Left 06/09/2013    Procedure: TOTAL KNEE ARTHROPLASTY;  Surgeon: Nilda Simmer, MD;  Location: [facility];  Service: Orthopedics;  Laterality: Left;    Current Outpatient Prescriptions  Medication Sig Dispense Refill  .  amLODipine (NORVASC) 5 MG tablet Take 5 mg by mouth daily.    Marland Kitchen aspirin EC 325 MG EC tablet 1 tab a day for the next 30 days to prevent blood clots 30 tablet 0  . calcium carbonate 1250 MG capsule Take 1,200 mg by mouth 2 (two) times daily with a meal.    . Cholecalciferol (VITAMIN D) 2000 UNITS CAPS Take 2,000 Units by mouth daily.     . citalopram (CELEXA) 20 MG tablet Take 20 mg by mouth daily.     . fish oil-omega-3 fatty acids 1000 MG capsule Take 2,400 mg by mouth 2 (two) times daily.    Marland Kitchen losartan-hydrochlorothiazide (HYZAAR) 50-12.5 MG per tablet Take 1 tablet by mouth daily.     . metoprolol succinate (TOPROL-XL) 25 MG 24 hr tablet daily.    . montelukast (SINGULAIR) 10 MG tablet     . Multiple Vitamin (MULTIVITAMIN) tablet Take 1 tablet by mouth daily.    Marland Kitchen omeprazole (PRILOSEC) 20 MG capsule     . solifenacin (VESICARE) 10 MG tablet Take 10 mg by mouth.     No current facility-administered medications for this visit.    Family History  Problem Relation Age of Onset  . Heart disease Mother   . Heart disease Father   . Heart attack Mother   . Heart attack Father   .  Stroke Mother   . Hypertension    . Clotting disorder Sister     sister died of a PE post op    ROS:  Pertinent items are noted in HPI.  Otherwise, a comprehensive ROS was negative.  Exam:   BP 112/64 mmHg  Pulse 70  Resp 20  Ht 5' 2.75" (1.594 m)  Wt 229 lb (103.874 kg)  BMI 40.88 kg/m2  LMP 06/26/1996 Height: 5' 2.75" (159.4 cm) Ht Readings from Last 3 Encounters:  02/26/15 5' 2.75" (1.594 m)  02/24/14 5' 3.25" (1.607 m)  12/03/13 5\' 4"  (1.626 m)    General appearance: alert, cooperative and appears stated age Head: Normocephalic, without obvious abnormality, atraumatic Neck: no adenopathy, supple, symmetrical, trachea midline and thyroid normal to inspection and palpation Lungs: clear to auscultation bilaterally Breasts: normal appearance, no masses or tenderness, No nipple retraction or  dimpling, No nipple discharge or bleeding, No axillary or supraclavicular adenopathy Heart: regular rate and rhythm Abdomen: soft, non-tender; no masses,  no organomegaly Extremities: extremities normal, atraumatic, no cyanosis or edema Skin: Skin color, texture, turgor normal. No rashes or lesions Lymph nodes: Cervical, supraclavicular, and axillary nodes normal. No abnormal inguinal nodes palpated Neurologic: Grossly normal   Pelvic: External genitalia:  no lesions, lacelike decrease pigment pattern consistent with LS noted at clitoral hood area and skin at fourchette area. Groin creases clear today.              Urethra:  normal appearing urethra with no masses, tenderness or lesions              Bartholin's and Skene's: normal                 Vagina: normal appearing vagina with normal color and discharge, no lesions              Cervix: non tender, no lesions              Pap taken: No. Bimanual Exam:  Uterus:  normal size, contour, position, consistency, mobility, non-tender, difficult to palpate due to body habitus              Adnexa: no mass, fullness, tenderness and adnexal not palpated, due body habitus               Rectovaginal: Confirms               Anus:  normal sphincter tone, no lesions  Chaperone present: yes  A:  Well Woman with normal exam  Menopausal no HRT  Lichen Sclerosus on the vulva history  Chronic yeast dermatitis in groin area, good result with Mycostatin powder  Hypertension/cholesterol/depression with PCP management  Urinary frequency/stress incontinence with Urology management  P:   Reviewed health and wellness pertinent to exam  Aware of need to evaluate if vaginal bleeding  Discussed LS flare noted and need to treat bid for 2-4 weeks and then daily if still itching for 2 weeks. Patient will look at area if resolved stop medication, rather than coming in.   Rx Clobetasol see order with instructions  Rx Mycostatin see order with  instructions  Continue MD follow up as indicated  Pap smear as above not taken    counseled on breast self exam, mammography screening, feminine hygiene, adequate intake of calcium and vitamin D, diet and exercise, Kegel's exercises  return annually or prn  An After Visit Summary was printed and given to the patient.

## 2015-07-07 DIAGNOSIS — N3946 Mixed incontinence: Secondary | ICD-10-CM | POA: Insufficient documentation

## 2015-07-07 DIAGNOSIS — G609 Hereditary and idiopathic neuropathy, unspecified: Secondary | ICD-10-CM | POA: Insufficient documentation

## 2015-07-07 DIAGNOSIS — Z86010 Personal history of colonic polyps: Secondary | ICD-10-CM | POA: Insufficient documentation

## 2015-07-07 DIAGNOSIS — G4733 Obstructive sleep apnea (adult) (pediatric): Secondary | ICD-10-CM | POA: Insufficient documentation

## 2015-08-16 ENCOUNTER — Other Ambulatory Visit: Payer: Self-pay

## 2015-08-16 DIAGNOSIS — Z1231 Encounter for screening mammogram for malignant neoplasm of breast: Secondary | ICD-10-CM

## 2015-08-31 ENCOUNTER — Ambulatory Visit
Admission: RE | Admit: 2015-08-31 | Discharge: 2015-08-31 | Disposition: A | Discharge status: Home / Self Care | Payer: Medicare PPO | Source: Ambulatory Visit

## 2015-08-31 DIAGNOSIS — Z1231 Encounter for screening mammogram for malignant neoplasm of breast: Secondary | ICD-10-CM

## 2016-01-07 DIAGNOSIS — R490 Dysphonia: Secondary | ICD-10-CM | POA: Insufficient documentation

## 2016-01-07 DIAGNOSIS — F5101 Primary insomnia: Secondary | ICD-10-CM | POA: Insufficient documentation

## 2016-03-01 ENCOUNTER — Encounter: Payer: Self-pay | Admitting: Certified Nurse Midwife

## 2016-03-01 ENCOUNTER — Ambulatory Visit (INDEPENDENT_AMBULATORY_CARE_PROVIDER_SITE_OTHER): Payer: Medicare PPO | Admitting: Certified Nurse Midwife

## 2016-03-01 VITALS — BP 114/64 | HR 76 | Resp 16 | Ht 62.75 in | Wt 237.0 lb

## 2016-03-01 DIAGNOSIS — Z01419 Encounter for gynecological examination (general) (routine) without abnormal findings: Secondary | ICD-10-CM

## 2016-03-01 DIAGNOSIS — Z124 Encounter for screening for malignant neoplasm of cervix: Secondary | ICD-10-CM

## 2016-03-01 NOTE — Progress Notes (Signed)
75 y.o. [id number] Divorced  Caucasian Fe here for annual exam.Menopausal no vaginal bleeding or vaginal dryness. Leaks urine with cough and sneeze and spontaneously. Had 'Bladder tack" surgery with Dr. Eskew(Urology), not successful. Taking Detrol with some reduction. Continues to leak spontaneous at times, but does have incontinence at night. Wears depends, which has increased vaginal irritation. No LS flares this year! Sees Dr. Brynda RimGallemore for hypertension/anxiety medication management and labs. Sees GI for GERD. No gyn health issues today. Worried about family in [place] with [place] warnings.   Patient's last menstrual period was 06/26/1996.          Sexually active: No.  The current method of family planning is tubal ligation.    Exercising: Yes.    water aerobics Smoker:  no  Health Maintenance: Pap:  12-03-09 neg MMG:  08-31-15 category b density birads 1:neg Colonoscopy:  2017  neg BMD:   2016 TDaP:  2014 Shingles: had done Pneumonia: had done Hep C and HIV: not done Labs: pcp Self breast exam: done occ   reports that she has never smoked. She has never used smokeless tobacco. She reports that she does not drink alcohol or use drugs.  Past Medical History:  Diagnosis Date  . Abnormal uterine bleeding (AUB)   . Depression   . HTN (hypertension)   . Left knee DJD   . Neuropathy (HCC)   . Obesity   . SUI (stress urinary incontinence, female)     Past Surgical History:  Procedure Laterality Date  . ANKLE FRACTURE SURGERY Left 04   fusion  . APPENDECTOMY    . BACK SURGERY    . EYE SURGERY Bilateral 14   catarcts  . KNEE ARTHROSCOPY Left 12/2011  . LUMBAR DISC SURGERY  12/94  . TOTAL KNEE ARTHROPLASTY Left 06/09/2013   DR Thurston HoleWAINER  . TOTAL KNEE ARTHROPLASTY Left 06/09/2013   Procedure: TOTAL KNEE ARTHROPLASTY;  Surgeon: Nilda Simmerobert A Wainer, MD;  Location: [facility];  Service: Orthopedics;  Laterality: Left;  . TUBAL LIGATION Bilateral     Current Outpatient Prescriptions   Medication Sig Dispense Refill  . amLODipine (NORVASC) 5 MG tablet Take 5 mg by mouth daily.    Marland Kitchen. aspirin EC 325 MG EC tablet 1 tab a day for the next 30 days to prevent blood clots 30 tablet 0  . calcium carbonate 1250 MG capsule Take 1,200 mg by mouth 2 (two) times daily with a meal.    . Cholecalciferol (VITAMIN D) 2000 UNITS CAPS Take 2,000 Units by mouth daily.     . citalopram (CELEXA) 20 MG tablet Take 20 mg by mouth daily.     . fish oil-omega-3 fatty acids 1000 MG capsule Take 2,400 mg by mouth 2 (two) times daily.    Marland Kitchen. losartan-hydrochlorothiazide (HYZAAR) 100-25 MG tablet Take by mouth.    . metoprolol succinate (TOPROL-XL) 25 MG 24 hr tablet daily.    . montelukast (SINGULAIR) 10 MG tablet     . Multiple Vitamin (MULTIVITAMIN) tablet Take 1 tablet by mouth daily.    Marland Kitchen. omeprazole (PRILOSEC) 20 MG capsule     . tolterodine (DETROL LA) 4 MG 24 hr capsule Take 4 mg by mouth.     No current facility-administered medications for this visit.     Family History  Problem Relation Age of Onset  . Heart disease Mother   . Heart disease Father   . Heart attack Mother   . Heart attack Father   . Stroke Mother   .  Hypertension    . Clotting disorder Sister     sister died of a PE post op    ROS:  Pertinent items are noted in HPI.  Otherwise, a comprehensive ROS was negative.  Exam:   BP 114/64   Pulse 76   Resp 16   Ht 5' 2.75" (1.594 m)   Wt 237 lb (107.5 kg)   LMP 06/26/1996   BMI 42.32 kg/m  Height: 5' 2.75" (159.4 cm) Ht Readings from Last 3 Encounters:  03/01/16 5' 2.75" (1.594 m)  02/26/15 5' 2.75" (1.594 m)  02/24/14 5' 3.25" (1.607 m)    General appearance: alert, cooperative and appears stated age Head: Normocephalic, without obvious abnormality, atraumatic Neck: no adenopathy, supple, symmetrical, trachea midline and thyroid normal to inspection and palpation Lungs: clear to auscultation bilaterally Breasts: normal appearance, no masses or tenderness, No  nipple retraction or dimpling, No nipple discharge or bleeding, No axillary or supraclavicular adenopathy Heart: regular rate and rhythm Abdomen: soft, non-tender; no masses,  no organomegaly Extremities: extremities normal, atraumatic, no cyanosis or edema Skin: Skin color, texture, turgor normal. No rashes or lesions Lymph nodes: Cervical, supraclavicular, and axillary nodes normal. No abnormal inguinal nodes palpated Neurologic: Grossly normal   Pelvic: External genitalia:  no lesions              Urethra:  normal appearing urethra with no masses, tenderness or lesions              Bartholin's and Skene's: normal                 Vagina: normal appearing vagina with normal color and discharge, no lesions, cystocele noted, grade 2, with obvious urine leakage with exam              Cervix: multiparous appearance, no cervical motion tenderness and no lesions              Pap taken: Yes.   Bimanual Exam:  Uterus:  normal size, contour, position, consistency, mobility, non-tender              Adnexa: normal adnexa and no mass, fullness, tenderness               Rectovaginal: Confirms               Anus:  normal sphincter tone, no lesions  Chaperone present: yes  A:  Well Woman with normal exam  Menopausal no HRT  Hypertension/anxiety with PCP management  Recent surgery for bladder suspension, failed per patient and Dr.Eskew.  Urinary incontinence with Urology management  P:   Reviewed health and wellness pertinent to exam  Aware of need to evaluate if vaginal bleeding  Continue MD follow up as indicated  Discuss management with depends and avoiding yeast infection and irritation from incontinence. Questions addressed.  Pap smear as above  counseled on breast self exam, mammography screening, menopause, adequate intake of calcium and vitamin D, diet and exercise  return annually or prn  An After Visit Summary was printed and given to the patient.

## 2016-03-01 NOTE — Patient Instructions (Signed)

## 2016-03-03 NOTE — Progress Notes (Signed)
Encounter reviewed Zilpha Mcandrew, MD   

## 2016-03-06 LAB — IPS PAP SMEAR ONLY

## 2016-08-02 ENCOUNTER — Other Ambulatory Visit: Payer: Self-pay | Admitting: Internal Medicine

## 2016-08-02 DIAGNOSIS — Z1231 Encounter for screening mammogram for malignant neoplasm of breast: Secondary | ICD-10-CM

## 2016-08-07 DIAGNOSIS — F3342 Major depressive disorder, recurrent, in full remission: Secondary | ICD-10-CM | POA: Insufficient documentation

## 2016-08-30 ENCOUNTER — Encounter (HOSPITAL_COMMUNITY): Payer: Self-pay | Admitting: Physician Assistant

## 2016-08-30 DIAGNOSIS — M1731 Unilateral post-traumatic osteoarthritis, right knee: Secondary | ICD-10-CM | POA: Diagnosis present

## 2016-08-30 DIAGNOSIS — Z96652 Presence of left artificial knee joint: Secondary | ICD-10-CM

## 2016-08-30 NOTE — H&P (Signed)
TOTAL KNEE ADMISSION H&P  Patient is being admitted for right total knee arthroplasty.  Subjective:  Chief Complaint:right knee pain.  HPI: Jennifer Dyer, 76 y.o. female, has a history of pain and functional disability in the right knee due to trauma and has failed non-surgical conservative treatments for greater than 12 weeks to includeNSAID's and/or analgesics, corticosteriod injections, viscosupplementation injections, flexibility and strengthening excercises, supervised PT with diminished ADL's post treatment, use of assistive devices, weight reduction as appropriate and activity modification.  Onset of symptoms was gradual, starting 10 years ago with gradually worsening course since that time. The patient noted no past surgery on the right knee(s).  Patient currently rates pain in the right knee(s) at 10 out of 10 with activity. Patient has night pain, worsening of pain with activity and weight bearing, pain that interferes with activities of daily living, crepitus and joint swelling.  Patient has evidence of subchondral sclerosis, periarticular osteophytes and joint space narrowing by imaging studies. . There is no active infection.  Patient Active Problem List   Diagnosis Date Noted  . Post-traumatic osteoarthritis of right knee   . S/P total knee replacement, left   . Equinus deformity of foot, acquired 12/17/2013  . Plantar fasciitis, right 12/03/2013  . Metatarsal deformity 12/03/2013  . DJD (degenerative joint disease) of knee 06/09/2013  . Abnormal uterine bleeding (AUB)   . Left knee DJD   . Depression   . HTN (hypertension)   . Obesity   . SUI (stress urinary incontinence, female)    Past Medical History:  Diagnosis Date  . Abnormal uterine bleeding (AUB)   . Acid reflux   . Depression   . HTN (hypertension)   . Left knee DJD   . Neuropathy (HCC)   . Obesity   . Post-traumatic osteoarthritis of right knee   . S/P total knee replacement, left   . Sleep apnea    uses  CPAP  . SUI (stress urinary incontinence, female)     Past Surgical History:  Procedure Laterality Date  . ANKLE FRACTURE SURGERY Left 04   fusion  . APPENDECTOMY    . BACK SURGERY    . BLADDER SURGERY    . EYE SURGERY Bilateral 14   catarcts  . KNEE ARTHROSCOPY Left 12/2011  . LUMBAR DISC SURGERY  12/94  . TOTAL KNEE ARTHROPLASTY Left 06/09/2013   DR Thurston Hole  . TOTAL KNEE ARTHROPLASTY Left 06/09/2013   Procedure: TOTAL KNEE ARTHROPLASTY;  Surgeon: Nilda Simmer, MD;  Location: [facility] OR;  Service: Orthopedics;  Laterality: Left;  . TUBAL LIGATION Bilateral     No prescriptions prior to admission.   No Known Allergies  Social History  Substance Use Topics  . Smoking status: Never Smoker  . Smokeless tobacco: Never Used  . Alcohol use No    Family History  Problem Relation Age of Onset  . Heart disease Mother   . Heart attack Mother   . Stroke Mother   . Heart disease Father   . Heart attack Father   . Hypertension    . Clotting disorder Sister     sister died of a PE post op     ROS  Objective:  Physical Exam  Vital signs in last 24 hours: Temp:  [97.1 F (36.2 C)] 97.1 F (36.2 C) (03/07 1500) Pulse Rate:  [71] 71 (03/07 1500) BP: (115)/(69) 115/69 (03/07 1500) SpO2:  [95 %] 95 % (03/07 1500) Weight:  [108 kg (238 lb)] 108 kg (  238 lb) (03/07 1500)  Labs:   Estimated body mass index is 42.16 kg/m as calculated from the following:   Height as of this encounter: 5\' 3"  (1.6 m).   Weight as of this encounter: 108 kg (238 lb).   Imaging Review Plain radiographs demonstrate moderate degenerative joint disease of the right knee(s). The overall alignment ismild varus. The bone quality appears to be good for age and reported activity level.  Assessment/Plan:  End stage arthritis, right knee   The patient history, physical examination, clinical judgment of the provider and imaging studies are consistent with end stage degenerative joint disease of the right  knee(s) and total knee arthroplasty is deemed medically necessary. The treatment options including medical management, injection therapy arthroscopy and arthroplasty were discussed at length. The risks and benefits of total knee arthroplasty were presented and reviewed. The risks due to aseptic loosening, infection, stiffness, patella tracking problems, thromboembolic complications and other imponderables were discussed. The patient acknowledged the explanation, agreed to proceed with the plan and consent was signed. Patient is being admitted for inpatient treatment for surgery, pain control, PT, OT, prophylactic antibiotics, VTE prophylaxis, progressive ambulation and ADL's and discharge planning. The patient is planning to be discharged to skilled nursing facility [facility].  No tranxamic acid   Sister died of PE.  Will use Lovenox for DVT prophylaxis

## 2016-08-31 ENCOUNTER — Encounter (HOSPITAL_COMMUNITY): Payer: Self-pay

## 2016-08-31 ENCOUNTER — Ambulatory Visit
Admission: RE | Admit: 2016-08-31 | Discharge: 2016-08-31 | Disposition: A | Discharge status: Home / Self Care | Payer: Medicare PPO | Source: Ambulatory Visit | Attending: Internal Medicine | Admitting: Internal Medicine

## 2016-08-31 DIAGNOSIS — Z1231 Encounter for screening mammogram for malignant neoplasm of breast: Secondary | ICD-10-CM

## 2016-09-01 ENCOUNTER — Encounter (HOSPITAL_COMMUNITY)
Admission: RE | Admit: 2016-09-01 | Discharge: 2016-09-01 | Disposition: A | Discharge status: Home / Self Care | Payer: Medicare PPO | Source: Ambulatory Visit | Attending: Orthopedic Surgery | Admitting: Orthopedic Surgery

## 2016-09-01 ENCOUNTER — Encounter (HOSPITAL_COMMUNITY): Payer: Self-pay

## 2016-09-01 ENCOUNTER — Ambulatory Visit (HOSPITAL_COMMUNITY)
Admission: RE | Admit: 2016-09-01 | Discharge: 2016-09-01 | Disposition: A | Discharge status: Home / Self Care | Payer: Medicare PPO | Source: Ambulatory Visit | Attending: Physician Assistant | Admitting: Physician Assistant

## 2016-09-01 DIAGNOSIS — Z0183 Encounter for blood typing: Secondary | ICD-10-CM | POA: Diagnosis not present

## 2016-09-01 DIAGNOSIS — Z01818 Encounter for other preprocedural examination: Secondary | ICD-10-CM | POA: Diagnosis not present

## 2016-09-01 DIAGNOSIS — Z96652 Presence of left artificial knee joint: Secondary | ICD-10-CM | POA: Diagnosis not present

## 2016-09-01 DIAGNOSIS — Z79899 Other long term (current) drug therapy: Secondary | ICD-10-CM | POA: Insufficient documentation

## 2016-09-01 DIAGNOSIS — K219 Gastro-esophageal reflux disease without esophagitis: Secondary | ICD-10-CM | POA: Insufficient documentation

## 2016-09-01 DIAGNOSIS — I10 Essential (primary) hypertension: Secondary | ICD-10-CM | POA: Diagnosis not present

## 2016-09-01 DIAGNOSIS — R918 Other nonspecific abnormal finding of lung field: Secondary | ICD-10-CM | POA: Insufficient documentation

## 2016-09-01 DIAGNOSIS — M1711 Unilateral primary osteoarthritis, right knee: Secondary | ICD-10-CM | POA: Insufficient documentation

## 2016-09-01 DIAGNOSIS — Z01812 Encounter for preprocedural laboratory examination: Secondary | ICD-10-CM | POA: Diagnosis not present

## 2016-09-01 DIAGNOSIS — J069 Acute upper respiratory infection, unspecified: Secondary | ICD-10-CM | POA: Diagnosis not present

## 2016-09-01 DIAGNOSIS — Z7982 Long term (current) use of aspirin: Secondary | ICD-10-CM | POA: Diagnosis not present

## 2016-09-01 DIAGNOSIS — G4733 Obstructive sleep apnea (adult) (pediatric): Secondary | ICD-10-CM | POA: Insufficient documentation

## 2016-09-01 LAB — CBC WITH DIFFERENTIAL/PLATELET
BASOS ABS: 0 10*3/uL (ref 0.0–0.1)
Basophils Relative: 1 %
EOS ABS: 0.3 10*3/uL (ref 0.0–0.7)
EOS PCT: 4 %
HCT: 40.2 % (ref 36.0–46.0)
Hemoglobin: 13.5 g/dL (ref 12.0–15.0)
LYMPHS ABS: 2 10*3/uL (ref 0.7–4.0)
Lymphocytes Relative: 25 %
MCH: 29.1 pg (ref 26.0–34.0)
MCHC: 33.6 g/dL (ref 30.0–36.0)
MCV: 86.6 fL (ref 78.0–100.0)
Monocytes Absolute: 0.6 10*3/uL (ref 0.1–1.0)
Monocytes Relative: 8 %
Neutro Abs: 5 10*3/uL (ref 1.7–7.7)
Neutrophils Relative %: 62 %
Platelets: 295 10*3/uL (ref 150–400)
RBC: 4.64 MIL/uL (ref 3.87–5.11)
RDW: 15 % (ref 11.5–15.5)
WBC: 7.9 10*3/uL (ref 4.0–10.5)

## 2016-09-01 LAB — TYPE AND SCREEN
ABO/RH(D): A POS
ANTIBODY SCREEN: NEGATIVE

## 2016-09-01 LAB — PROTIME-INR
INR: 1.07
Prothrombin Time: 14 seconds (ref 11.4–15.2)

## 2016-09-01 LAB — COMPREHENSIVE METABOLIC PANEL
ALT: 13 U/L — AB (ref 14–54)
AST: 15 U/L (ref 15–41)
Albumin: 3.8 g/dL (ref 3.5–5.0)
Alkaline Phosphatase: 68 U/L (ref 38–126)
Anion gap: 8 (ref 5–15)
BUN: 15 mg/dL (ref 6–20)
CHLORIDE: 109 mmol/L (ref 101–111)
CO2: 25 mmol/L (ref 22–32)
CREATININE: 0.8 mg/dL (ref 0.44–1.00)
Calcium: 9.3 mg/dL (ref 8.9–10.3)
GFR calc non Af Amer: >60 mL/min (ref >60)
Glucose, Bld: 87 mg/dL (ref 65–99)
POTASSIUM: 3.1 mmol/L — AB (ref 3.5–5.1)
SODIUM: 142 mmol/L (ref 135–145)
Total Bilirubin: 1 mg/dL (ref 0.3–1.2)
Total Protein: 6.8 g/dL (ref 6.5–8.1)

## 2016-09-01 LAB — APTT: APTT: 33 s (ref 24–36)

## 2016-09-01 LAB — SURGICAL PCR SCREEN
MRSA, PCR: NEGATIVE
Staphylococcus aureus: NEGATIVE

## 2016-09-01 NOTE — Progress Notes (Signed)
PCp is Dr Brooke BonitoWarren Gallemore Denies ever seeing a cardiologist. Denies any chest pain. Instructed to bring her CPAP mask on the day of surgery-voices understanding. Denies ever having a card cath, stress test, or echo.

## 2016-09-01 NOTE — Progress Notes (Signed)
Ms Jennifer Dyer unable to obtain urine for culture Spoke with Sherri at Dr Clide CliffWainers office informed of above. States that we will need the urine before surgery. Ms Jennifer Dyer called (message left)and informed- asked her to bring in the urine on Monday  So we can get it to the lab before surgery. Asked for her to call me back when she gets this message.

## 2016-09-01 NOTE — Progress Notes (Signed)
Ms Jennifer Dyer did call back to confirm she got the message to bring in a urine on Monday.

## 2016-09-01 NOTE — Pre-Procedure Instructions (Signed)
Jennifer FootsDiana M Dyer  09/01/2016      [facility] [place] - [address] [place] [address] Phone: [phone] Fax: [phone]    Your procedure is scheduled on March 19  [facility] at 1045 A.M.  Call this number if you have problems the morning of surgery:  [phone]   Remember:  Do not eat food or drink liquids after midnight.  Take these medicines the morning of surgery with A SIP OF WATER  Amlodipine (Norvasc), citalopram (Celexa), Metoprolol succinate (Toprol-XL), omeprazole (prilosec), Tolterodine (DetrolLA)  Stop taking aspirin, BC's, Goody's, Herbal medications, Fish Oil, Ibuprofen, Advil, Motrin, Aleve, Celebrex   Do not wear jewelry, make-up or nail polish.  Do not wear lotions, powders, or perfumes, or deoderant.  Do not shave 48 hours prior to surgery.  Men may shave face and neck.  Do not bring valuables to the hospital.  [facility] is not responsible for any belongings or valuables.  Contacts, dentures or bridgework may not be worn into surgery.  Leave your suitcase in the car.  After surgery it may be brought to your room.  For patients admitted to the hospital, discharge time will be determined by your treatment team.  Patients discharged the day of surgery will not be allowed to drive home.   Special instructions:  [place] - Preparing for Surgery  Before surgery, you can play an important role.  Because skin is not sterile, your skin needs to be as free of germs as possible.  You can reduce the number of germs on you skin by washing with CHG (chlorahexidine gluconate) soap before surgery.  CHG is an antiseptic cleaner which kills germs and bonds with the skin to continue killing germs even after washing.  Please DO NOT use if you have an allergy to CHG or antibacterial soaps.  If your skin becomes reddened/irritated stop using the CHG and inform  your nurse when you arrive at Short Stay.  Do not shave (including legs and underarms) for at least 48 hours prior to the first CHG shower.  You may shave your face.  Please follow these instructions carefully:   1.  Shower with CHG Soap the night before surgery and the     morning of Surgery.  2.  If you choose to wash your hair, wash your hair first as usual with your  normal shampoo.  3.  After you shampoo, rinse your hair and body thoroughly to remove the Shampoo.  4.  Use CHG as you would any other liquid soap.  You can apply chg directly  to the skin and wash gently with scrungie or a clean washcloth.  5.  Apply the CHG Soap to your body ONLY FROM THE NECK DOWN.    Do not use on open wounds or open sores.  Avoid contact with your eyes,   ears, mouth and genitals (private parts).  Wash genitals (private parts)  with your normal soap.  6.  Wash thoroughly, paying special attention to the area where your surgery   will be performed.  7.  Thoroughly rinse your body with warm water from the neck down.  8.  DO NOT shower/wash with your normal soap after using and rinsing off the CHG Soap.  9.  Pat yourself dry with a clean towel.  10.  Wear clean pajamas.            11.  Place clean sheets on your bed the night of your first shower and do not sleep with pets.  Day of Surgery  Do not apply any lotions/deoderants the morning of surgery.  Please wear clean clothes to the hospital/surgery center.     Please read over the following fact sheets that you were given. Pain Booklet, Coughing and Deep Breathing, MRSA Information and Surgical Site Infection Prevention

## 2016-09-04 NOTE — Progress Notes (Addendum)
Anesthesia Chart Review:  Pt is a 76 year old female scheduled for R total knee arthroplasty on 09/11/2016 with Salvatore Marvelobert Wainer, MD.   PCP is Brooke BonitoWarren Gallemore, MD who cleared pt for surgery at last office visit 08/07/16.   PMH includes:  HTN, OSA, GERD.  Never smoker. BMI 41. S/p L TKA 06/09/13.   Medications include: amlodipine, ASA 81mg , losartan-HCTZ, metoprolol, prilosec  Preoperative labs reviewed.    CXR 09/01/16: New mild patchy opacity at the left lung base, which may represent a pneumonia. Recommend follow-up PA and lateral post treatment chest radiographs in 4-6 weeks. - I left notified Sherrie in Dr. Sherene SiresWainer's office about CXR results.   EKG 09/01/16: NSR  Rica Mastngela Kabbe, FNP-BC [facility]Anesthesiology Phone: [phone] 09/04/2016 12:17 PM   Addendum:   CT chest 09/07/16: Resolution of previously seen basilar opacity on the left. No new focal abnormality is seen.  If no changes, I anticipate pt can proceed with surgery as scheduled.   Rica Mastngela Kabbe, FNP-BC [facility]esthesiology Phone: [phone] 09/08/2016 1:10 PM

## 2016-09-05 ENCOUNTER — Other Ambulatory Visit: Payer: Self-pay | Admitting: Orthopedic Surgery

## 2016-09-05 DIAGNOSIS — R9389 Abnormal findings on diagnostic imaging of other specified body structures: Secondary | ICD-10-CM

## 2016-09-05 LAB — URINE CULTURE: Culture: NO GROWTH

## 2016-09-07 ENCOUNTER — Ambulatory Visit
Admission: RE | Admit: 2016-09-07 | Discharge: 2016-09-07 | Disposition: A | Discharge status: Home / Self Care | Payer: Medicare PPO | Source: Ambulatory Visit | Attending: Orthopedic Surgery | Admitting: Orthopedic Surgery

## 2016-09-07 DIAGNOSIS — R9389 Abnormal findings on diagnostic imaging of other specified body structures: Secondary | ICD-10-CM

## 2016-09-07 MED ORDER — IOPAMIDOL (ISOVUE-300) INJECTION 61%
100.0000 mL | Freq: Once | INTRAVENOUS | Status: AC | PRN
  Administered 2016-09-07: 100 mL via INTRAVENOUS

## 2016-09-08 MED ORDER — LACTATED RINGERS IV SOLN: INTRAVENOUS | Status: DC

## 2016-09-08 MED ORDER — CEFAZOLIN SODIUM-DEXTROSE 2-4 GM/100ML-% IV SOLN
2.0000 g | INTRAVENOUS | Status: AC
  Administered 2016-09-11: 2 g via INTRAVENOUS
  Filled 2016-09-08: qty 100

## 2016-09-11 ENCOUNTER — Encounter (HOSPITAL_COMMUNITY): Payer: Self-pay | Admitting: Certified Registered"

## 2016-09-11 ENCOUNTER — Inpatient Hospital Stay (HOSPITAL_COMMUNITY)
Admission: RE | Admit: 2016-09-11 | Discharge: 2016-09-13 | DRG: 470 | Disposition: A | Discharge status: Skilled Nursing Facility | Payer: Medicare PPO | Source: Ambulatory Visit | Attending: Orthopedic Surgery | Admitting: Orthopedic Surgery

## 2016-09-11 ENCOUNTER — Inpatient Hospital Stay (HOSPITAL_COMMUNITY): Payer: Medicare PPO | Admitting: Emergency Medicine

## 2016-09-11 ENCOUNTER — Inpatient Hospital Stay (HOSPITAL_COMMUNITY): Payer: Medicare PPO | Admitting: Certified Registered"

## 2016-09-11 ENCOUNTER — Encounter (HOSPITAL_COMMUNITY)
Admission: RE | Disposition: A | Discharge status: Skilled Nursing Facility | Payer: Self-pay | Source: Ambulatory Visit | Attending: Orthopedic Surgery

## 2016-09-11 DIAGNOSIS — Z832 Family history of diseases of the blood and blood-forming organs and certain disorders involving the immune mechanism: Secondary | ICD-10-CM

## 2016-09-11 DIAGNOSIS — M179 Osteoarthritis of knee, unspecified: Secondary | ICD-10-CM | POA: Diagnosis present

## 2016-09-11 DIAGNOSIS — R0989 Other specified symptoms and signs involving the circulatory and respiratory systems: Secondary | ICD-10-CM | POA: Diagnosis not present

## 2016-09-11 DIAGNOSIS — Z791 Long term (current) use of non-steroidal anti-inflammatories (NSAID): Secondary | ICD-10-CM | POA: Diagnosis not present

## 2016-09-11 DIAGNOSIS — K219 Gastro-esophageal reflux disease without esophagitis: Secondary | ICD-10-CM | POA: Diagnosis not present

## 2016-09-11 DIAGNOSIS — E669 Obesity, unspecified: Secondary | ICD-10-CM | POA: Diagnosis present

## 2016-09-11 DIAGNOSIS — I10 Essential (primary) hypertension: Secondary | ICD-10-CM | POA: Diagnosis not present

## 2016-09-11 DIAGNOSIS — G629 Polyneuropathy, unspecified: Secondary | ICD-10-CM | POA: Diagnosis present

## 2016-09-11 DIAGNOSIS — Z8249 Family history of ischemic heart disease and other diseases of the circulatory system: Secondary | ICD-10-CM | POA: Diagnosis not present

## 2016-09-11 DIAGNOSIS — F32A Depression, unspecified: Secondary | ICD-10-CM | POA: Diagnosis present

## 2016-09-11 DIAGNOSIS — Z96652 Presence of left artificial knee joint: Secondary | ICD-10-CM

## 2016-09-11 DIAGNOSIS — Z6841 Body Mass Index (BMI) 40.0 and over, adult: Secondary | ICD-10-CM | POA: Diagnosis not present

## 2016-09-11 DIAGNOSIS — M1710 Unilateral primary osteoarthritis, unspecified knee: Secondary | ICD-10-CM | POA: Diagnosis present

## 2016-09-11 DIAGNOSIS — F329 Major depressive disorder, single episode, unspecified: Secondary | ICD-10-CM | POA: Diagnosis present

## 2016-09-11 DIAGNOSIS — M21161 Varus deformity, not elsewhere classified, right knee: Secondary | ICD-10-CM | POA: Diagnosis present

## 2016-09-11 DIAGNOSIS — N393 Stress incontinence (female) (male): Secondary | ICD-10-CM | POA: Diagnosis present

## 2016-09-11 DIAGNOSIS — M1731 Unilateral post-traumatic osteoarthritis, right knee: Secondary | ICD-10-CM | POA: Diagnosis not present

## 2016-09-11 HISTORY — DX: Presence of left artificial knee joint: Z96.652

## 2016-09-11 HISTORY — PX: TOTAL KNEE ARTHROPLASTY: SHX125

## 2016-09-11 HISTORY — DX: Unilateral post-traumatic osteoarthritis, right knee: M17.31

## 2016-09-11 LAB — CBC
HCT: 37.8 % (ref 36.0–46.0)
Hemoglobin: 12.7 g/dL (ref 12.0–15.0)
MCH: 29.3 pg (ref 26.0–34.0)
MCHC: 33.6 g/dL (ref 30.0–36.0)
MCV: 87.3 fL (ref 78.0–100.0)
PLATELETS: 236 10*3/uL (ref 150–400)
RBC: 4.33 MIL/uL (ref 3.87–5.11)
RDW: 15.4 % (ref 11.5–15.5)
WBC: 11.9 10*3/uL — AB (ref 4.0–10.5)

## 2016-09-11 LAB — CREATININE, SERUM
Creatinine, Ser: 0.91 mg/dL (ref 0.44–1.00)
GFR calc non Af Amer: >60 mL/min (ref >60)

## 2016-09-11 SURGERY — ARTHROPLASTY, KNEE, TOTAL
Anesthesia: General | Site: Knee | Laterality: Right

## 2016-09-11 MED ORDER — MIDAZOLAM HCL 2 MG/2ML IJ SOLN
INTRAMUSCULAR | Status: AC
  Filled 2016-09-11: qty 2

## 2016-09-11 MED ORDER — FESOTERODINE FUMARATE ER 8 MG PO TB24
8.0000 mg | ORAL_TABLET | Freq: Every day | ORAL | Status: DC
  Administered 2016-09-12 – 2016-09-13 (×2): 8 mg via ORAL
  Filled 2016-09-11 (×2): qty 1

## 2016-09-11 MED ORDER — DEXAMETHASONE SODIUM PHOSPHATE 10 MG/ML IJ SOLN
10.0000 mg | Freq: Three times a day (TID) | INTRAMUSCULAR | Status: AC
  Administered 2016-09-11 – 2016-09-12 (×4): 10 mg via INTRAVENOUS
  Filled 2016-09-11 (×4): qty 1

## 2016-09-11 MED ORDER — DEXAMETHASONE SODIUM PHOSPHATE 10 MG/ML IJ SOLN
INTRAMUSCULAR | Status: DC | PRN
  Administered 2016-09-11: 10 mg via INTRAVENOUS

## 2016-09-11 MED ORDER — CITALOPRAM HYDROBROMIDE 20 MG PO TABS
20.0000 mg | ORAL_TABLET | Freq: Every day | ORAL | Status: DC
  Administered 2016-09-12 – 2016-09-13 (×2): 20 mg via ORAL
  Filled 2016-09-11 (×2): qty 1

## 2016-09-11 MED ORDER — ONDANSETRON HCL 4 MG/2ML IJ SOLN: 4.0000 mg | Freq: Four times a day (QID) | INTRAMUSCULAR | Status: DC | PRN

## 2016-09-11 MED ORDER — ROCURONIUM BROMIDE 100 MG/10ML IV SOLN
INTRAVENOUS | Status: DC | PRN
  Administered 2016-09-11: 5 mg via INTRAVENOUS
  Administered 2016-09-11: 15 mg via INTRAVENOUS

## 2016-09-11 MED ORDER — POVIDONE-IODINE 7.5 % EX SOLN
Freq: Once | CUTANEOUS | Status: DC
  Filled 2016-09-11: qty 118

## 2016-09-11 MED ORDER — ADULT MULTIVITAMIN W/MINERALS CH
1.0000 | ORAL_TABLET | Freq: Every day | ORAL | Status: DC
  Administered 2016-09-12 – 2016-09-13 (×2): 1 via ORAL
  Filled 2016-09-11 (×2): qty 1

## 2016-09-11 MED ORDER — POTASSIUM CHLORIDE IN NACL 20-0.9 MEQ/L-% IV SOLN
INTRAVENOUS | Status: DC
  Administered 2016-09-11: 14:00:00 via INTRAVENOUS
  Administered 2016-09-11 – 2016-09-12 (×2): 100 mL/h via INTRAVENOUS
  Filled 2016-09-11 (×2): qty 1000

## 2016-09-11 MED ORDER — 0.9 % SODIUM CHLORIDE (POUR BTL) OPTIME
TOPICAL | Status: DC | PRN
  Administered 2016-09-11: 1000 mL

## 2016-09-11 MED ORDER — BUPIVACAINE HCL (PF) 0.25 % IJ SOLN
INTRAMUSCULAR | Status: AC
  Filled 2016-09-11: qty 30

## 2016-09-11 MED ORDER — KETOROLAC TROMETHAMINE 30 MG/ML IJ SOLN
30.0000 mg | Freq: Once | INTRAMUSCULAR | Status: AC
  Administered 2016-09-11: 30 mg via INTRAVENOUS

## 2016-09-11 MED ORDER — KETOROLAC TROMETHAMINE 30 MG/ML IJ SOLN
INTRAMUSCULAR | Status: AC
  Filled 2016-09-11: qty 1

## 2016-09-11 MED ORDER — ACETAMINOPHEN 500 MG PO TABS
1000.0000 mg | ORAL_TABLET | Freq: Four times a day (QID) | ORAL | Status: AC
  Administered 2016-09-11 – 2016-09-12 (×3): 1000 mg via ORAL
  Filled 2016-09-11 (×3): qty 2

## 2016-09-11 MED ORDER — ONDANSETRON HCL 4 MG PO TABS: 4.0000 mg | ORAL_TABLET | Freq: Four times a day (QID) | ORAL | Status: DC | PRN

## 2016-09-11 MED ORDER — FENTANYL CITRATE (PF) 100 MCG/2ML IJ SOLN
INTRAMUSCULAR | Status: AC
  Filled 2016-09-11: qty 4

## 2016-09-11 MED ORDER — CALCIUM CARBONATE 1250 (500 CA) MG PO TABS
1200.0000 mg | ORAL_TABLET | Freq: Two times a day (BID) | ORAL | Status: DC
  Administered 2016-09-11 – 2016-09-13 (×4): 1250 mg via ORAL
  Filled 2016-09-11 (×4): qty 1

## 2016-09-11 MED ORDER — ACETAMINOPHEN 500 MG PO TABS
ORAL_TABLET | ORAL | Status: AC
  Filled 2016-09-11: qty 2

## 2016-09-11 MED ORDER — MIDAZOLAM HCL 5 MG/5ML IJ SOLN
INTRAMUSCULAR | Status: DC | PRN
  Administered 2016-09-11 (×4): 1 mg via INTRAVENOUS

## 2016-09-11 MED ORDER — LOSARTAN POTASSIUM 50 MG PO TABS
100.0000 mg | ORAL_TABLET | Freq: Every day | ORAL | Status: DC
  Administered 2016-09-13: 100 mg via ORAL
  Filled 2016-09-11: qty 2

## 2016-09-11 MED ORDER — CEFAZOLIN SODIUM-DEXTROSE 2-4 GM/100ML-% IV SOLN
2.0000 g | Freq: Four times a day (QID) | INTRAVENOUS | Status: AC
  Administered 2016-09-11 (×2): 2 g via INTRAVENOUS
  Filled 2016-09-11 (×2): qty 100

## 2016-09-11 MED ORDER — SODIUM CHLORIDE 0.9 % IR SOLN
Status: DC | PRN
  Administered 2016-09-11: 3000 mL

## 2016-09-11 MED ORDER — HYDROMORPHONE HCL 2 MG/ML IJ SOLN: 1.0000 mg | INTRAMUSCULAR | Status: DC | PRN

## 2016-09-11 MED ORDER — NEOSTIGMINE METHYLSULFATE 10 MG/10ML IV SOLN
INTRAVENOUS | Status: DC | PRN
  Administered 2016-09-11: 2 mg via INTRAVENOUS

## 2016-09-11 MED ORDER — ACETAMINOPHEN 650 MG RE SUPP: 650.0000 mg | Freq: Four times a day (QID) | RECTAL | Status: DC | PRN

## 2016-09-11 MED ORDER — ALUM & MAG HYDROXIDE-SIMETH 200-200-20 MG/5ML PO SUSP: 30.0000 mL | ORAL | Status: DC | PRN

## 2016-09-11 MED ORDER — BUPIVACAINE-EPINEPHRINE 0.25% -1:200000 IJ SOLN
INTRAMUSCULAR | Status: DC | PRN
  Administered 2016-09-11: 30 mL

## 2016-09-11 MED ORDER — ENOXAPARIN SODIUM 30 MG/0.3ML ~~LOC~~ SOLN
30.0000 mg | Freq: Two times a day (BID) | SUBCUTANEOUS | Status: DC
  Administered 2016-09-12 – 2016-09-13 (×3): 30 mg via SUBCUTANEOUS
  Filled 2016-09-11 (×3): qty 0.3

## 2016-09-11 MED ORDER — CHLORHEXIDINE GLUCONATE 4 % EX LIQD: 60.0000 mL | Freq: Once | CUTANEOUS | Status: DC

## 2016-09-11 MED ORDER — METOPROLOL SUCCINATE ER 25 MG PO TB24
25.0000 mg | ORAL_TABLET | Freq: Every day | ORAL | Status: DC
  Administered 2016-09-12 – 2016-09-13 (×2): 25 mg via ORAL
  Filled 2016-09-11 (×2): qty 1

## 2016-09-11 MED ORDER — ONDANSETRON HCL 4 MG/2ML IJ SOLN
INTRAMUSCULAR | Status: DC | PRN
  Administered 2016-09-11: 4 mg via INTRAVENOUS

## 2016-09-11 MED ORDER — GLYCOPYRROLATE 0.2 MG/ML IJ SOLN
INTRAMUSCULAR | Status: DC | PRN
  Administered 2016-09-11: 0.3 mg via INTRAVENOUS

## 2016-09-11 MED ORDER — FENTANYL CITRATE (PF) 100 MCG/2ML IJ SOLN
INTRAMUSCULAR | Status: AC
  Filled 2016-09-11: qty 2

## 2016-09-11 MED ORDER — ACETAMINOPHEN 500 MG PO TABS
1000.0000 mg | ORAL_TABLET | Freq: Once | ORAL | Status: AC
  Administered 2016-09-11: 1000 mg via ORAL

## 2016-09-11 MED ORDER — MONTELUKAST SODIUM 10 MG PO TABS
10.0000 mg | ORAL_TABLET | Freq: Every day | ORAL | Status: DC
  Administered 2016-09-11 – 2016-09-12 (×2): 10 mg via ORAL
  Filled 2016-09-11 (×2): qty 1

## 2016-09-11 MED ORDER — POLYETHYLENE GLYCOL 3350 17 G PO PACK
17.0000 g | PACK | Freq: Two times a day (BID) | ORAL | Status: DC
  Administered 2016-09-11 – 2016-09-13 (×3): 17 g via ORAL
  Filled 2016-09-11 (×4): qty 1

## 2016-09-11 MED ORDER — SUCCINYLCHOLINE CHLORIDE 20 MG/ML IJ SOLN
INTRAMUSCULAR | Status: DC | PRN
  Administered 2016-09-11: 140 mg via INTRAVENOUS

## 2016-09-11 MED ORDER — ACETAMINOPHEN 325 MG PO TABS
650.0000 mg | ORAL_TABLET | Freq: Four times a day (QID) | ORAL | Status: DC | PRN
  Administered 2016-09-12: 650 mg via ORAL
  Filled 2016-09-11: qty 2

## 2016-09-11 MED ORDER — PROMETHAZINE HCL 25 MG/ML IJ SOLN: 6.2500 mg | INTRAMUSCULAR | Status: DC | PRN

## 2016-09-11 MED ORDER — METOCLOPRAMIDE HCL 5 MG/ML IJ SOLN: 5.0000 mg | Freq: Three times a day (TID) | INTRAMUSCULAR | Status: DC | PRN

## 2016-09-11 MED ORDER — HYDROMORPHONE HCL 1 MG/ML IJ SOLN: 1.0000 mg | INTRAMUSCULAR | Status: DC | PRN

## 2016-09-11 MED ORDER — PANTOPRAZOLE SODIUM 40 MG PO TBEC
40.0000 mg | DELAYED_RELEASE_TABLET | Freq: Every day | ORAL | Status: DC
  Administered 2016-09-12 – 2016-09-13 (×2): 40 mg via ORAL
  Filled 2016-09-11 (×2): qty 1

## 2016-09-11 MED ORDER — FENTANYL CITRATE (PF) 100 MCG/2ML IJ SOLN
INTRAMUSCULAR | Status: DC | PRN
  Administered 2016-09-11 (×8): 50 ug via INTRAVENOUS

## 2016-09-11 MED ORDER — ROPIVACAINE HCL 7.5 MG/ML IJ SOLN
INTRAMUSCULAR | Status: DC | PRN
  Administered 2016-09-11: 20 mL via PERINEURAL

## 2016-09-11 MED ORDER — MENTHOL 3 MG MT LOZG: 1.0000 | LOZENGE | OROMUCOSAL | Status: DC | PRN

## 2016-09-11 MED ORDER — PROPOFOL 10 MG/ML IV BOLUS
INTRAVENOUS | Status: AC
  Filled 2016-09-11: qty 40

## 2016-09-11 MED ORDER — LIDOCAINE HCL (CARDIAC) 20 MG/ML IV SOLN
INTRAVENOUS | Status: DC | PRN
  Administered 2016-09-11: 60 mg via INTRAVENOUS

## 2016-09-11 MED ORDER — HYDROCHLOROTHIAZIDE 25 MG PO TABS
25.0000 mg | ORAL_TABLET | Freq: Every day | ORAL | Status: DC
  Administered 2016-09-13: 25 mg via ORAL
  Filled 2016-09-11: qty 1

## 2016-09-11 MED ORDER — DOCUSATE SODIUM 100 MG PO CAPS
100.0000 mg | ORAL_CAPSULE | Freq: Two times a day (BID) | ORAL | Status: DC
  Administered 2016-09-11 – 2016-09-13 (×4): 100 mg via ORAL
  Filled 2016-09-11 (×4): qty 1

## 2016-09-11 MED ORDER — PROPOFOL 10 MG/ML IV BOLUS
INTRAVENOUS | Status: AC
  Filled 2016-09-11: qty 20

## 2016-09-11 MED ORDER — CELECOXIB 100 MG PO CAPS
100.0000 mg | ORAL_CAPSULE | Freq: Two times a day (BID) | ORAL | Status: DC | PRN
  Filled 2016-09-11: qty 1

## 2016-09-11 MED ORDER — PROPOFOL 10 MG/ML IV BOLUS
INTRAVENOUS | Status: DC | PRN
  Administered 2016-09-11: 150 mg via INTRAVENOUS
  Administered 2016-09-11: 30 mg via INTRAVENOUS
  Administered 2016-09-11 (×4): 20 mg via INTRAVENOUS

## 2016-09-11 MED ORDER — PHENOL 1.4 % MT LIQD: 1.0000 | OROMUCOSAL | Status: DC | PRN

## 2016-09-11 MED ORDER — AMLODIPINE BESYLATE 5 MG PO TABS
5.0000 mg | ORAL_TABLET | Freq: Every day | ORAL | Status: DC
  Administered 2016-09-13: 5 mg via ORAL
  Filled 2016-09-11: qty 1

## 2016-09-11 MED ORDER — OXYCODONE HCL 5 MG PO TABS
5.0000 mg | ORAL_TABLET | ORAL | Status: DC | PRN
  Administered 2016-09-11 – 2016-09-13 (×7): 10 mg via ORAL
  Filled 2016-09-11 (×8): qty 2

## 2016-09-11 MED ORDER — LACTATED RINGERS IV SOLN
INTRAVENOUS | Status: DC | PRN
Start: 2016-09-11 — End: 2016-09-11
  Administered 2016-09-11 (×2): via INTRAVENOUS

## 2016-09-11 MED ORDER — LOSARTAN POTASSIUM-HCTZ 100-25 MG PO TABS: 1.0000 | ORAL_TABLET | Freq: Every day | ORAL | Status: DC

## 2016-09-11 MED ORDER — HYDROMORPHONE HCL 1 MG/ML IJ SOLN
INTRAMUSCULAR | Status: AC
  Administered 2016-09-11: 0.5 mg via INTRAVENOUS
  Filled 2016-09-11: qty 1

## 2016-09-11 MED ORDER — MEPERIDINE HCL 25 MG/ML IJ SOLN: 6.2500 mg | INTRAMUSCULAR | Status: DC | PRN

## 2016-09-11 MED ORDER — METOCLOPRAMIDE HCL 5 MG PO TABS: 5.0000 mg | ORAL_TABLET | Freq: Three times a day (TID) | ORAL | Status: DC | PRN

## 2016-09-11 MED ORDER — VITAMIN D 1000 UNITS PO TABS
1000.0000 [IU] | ORAL_TABLET | Freq: Every day | ORAL | Status: DC
  Administered 2016-09-12: 1000 [IU] via ORAL
  Filled 2016-09-11 (×3): qty 1

## 2016-09-11 MED ORDER — HYDROMORPHONE HCL 1 MG/ML IJ SOLN
0.2500 mg | INTRAMUSCULAR | Status: DC | PRN
  Administered 2016-09-11 (×4): 0.5 mg via INTRAVENOUS

## 2016-09-11 MED ORDER — OXYCODONE HCL 5 MG PO TABS
ORAL_TABLET | ORAL | Status: AC
  Administered 2016-09-11: 10 mg via ORAL
  Filled 2016-09-11: qty 2

## 2016-09-11 MED ORDER — EPINEPHRINE PF 1 MG/ML IJ SOLN
INTRAMUSCULAR | Status: AC
  Filled 2016-09-11: qty 1

## 2016-09-11 MED ORDER — DIPHENHYDRAMINE HCL 12.5 MG/5ML PO ELIX: 12.5000 mg | ORAL_SOLUTION | ORAL | Status: DC | PRN

## 2016-09-11 SURGICAL SUPPLY — 72 items
APL SKNCLS STERI-STRIP NONHPOA (GAUZE/BANDAGES/DRESSINGS) ×1
BANDAGE ESMARK 6X9 LF (GAUZE/BANDAGES/DRESSINGS) ×1 IMPLANT
BENZOIN TINCTURE PRP APPL 2/3 (GAUZE/BANDAGES/DRESSINGS) ×2 IMPLANT
BLADE SAGITTAL 25.0X1.19X90 (BLADE) ×2 IMPLANT
BLADE SAW SGTL 13X75X1.27 (BLADE) ×2 IMPLANT
BLADE SURG 10 STRL SS (BLADE) ×6 IMPLANT
BNDG CMPR 9X6 STRL LF SNTH (GAUZE/BANDAGES/DRESSINGS) ×1
BNDG CMPR MED 15X6 ELC VLCR LF (GAUZE/BANDAGES/DRESSINGS) ×1
BNDG ELASTIC 6X15 VLCR STRL LF (GAUZE/BANDAGES/DRESSINGS) ×2 IMPLANT
BNDG ESMARK 6X9 LF (GAUZE/BANDAGES/DRESSINGS) ×2
BOWL SMART MIX CTS (DISPOSABLE) ×2 IMPLANT
CAPT KNEE TOTAL 3 ATTUNE ×1 IMPLANT
CEMENT HV SMART SET (Cement) ×4 IMPLANT
COVER SURGICAL LIGHT HANDLE (MISCELLANEOUS) ×2 IMPLANT
CUFF TOURNIQUET SINGLE 34IN LL (TOURNIQUET CUFF) ×2 IMPLANT
CUFF TOURNIQUET SINGLE 44IN (TOURNIQUET CUFF) IMPLANT
DECANTER SPIKE VIAL GLASS SM (MISCELLANEOUS) ×1 IMPLANT
DRAPE EXTREMITY T 121X128X90 (DRAPE) ×2 IMPLANT
DRAPE HALF SHEET 40X57 (DRAPES) ×2 IMPLANT
DRAPE INCISE IOBAN 66X45 STRL (DRAPES) IMPLANT
DRAPE PROXIMA HALF (DRAPES) ×2 IMPLANT
DRAPE U-SHAPE 47X51 STRL (DRAPES) ×2 IMPLANT
DRSG AQUACEL AG ADV 3.5X14 (GAUZE/BANDAGES/DRESSINGS) ×2 IMPLANT
DURAPREP 26ML APPLICATOR (WOUND CARE) ×4 IMPLANT
ELECT CAUTERY BLADE 6.4 (BLADE) ×2 IMPLANT
ELECT REM PT RETURN 9FT ADLT (ELECTROSURGICAL) ×2
ELECTRODE REM PT RTRN 9FT ADLT (ELECTROSURGICAL) ×1 IMPLANT
FACESHIELD WRAPAROUND (MASK) ×4 IMPLANT
FACESHIELD WRAPAROUND OR TEAM (MASK) ×1 IMPLANT
FILTER STRAW FLUID ASPIR (MISCELLANEOUS) ×1 IMPLANT
GLOVE BIO SURGEON STRL SZ7 (GLOVE) ×2 IMPLANT
GLOVE BIOGEL PI IND STRL 7.0 (GLOVE) ×1 IMPLANT
GLOVE BIOGEL PI IND STRL 7.5 (GLOVE) ×1 IMPLANT
GLOVE BIOGEL PI INDICATOR 7.0 (GLOVE) ×1
GLOVE BIOGEL PI INDICATOR 7.5 (GLOVE) ×1
GLOVE SS BIOGEL STRL SZ 7.5 (GLOVE) ×1 IMPLANT
GLOVE SUPERSENSE BIOGEL SZ 7.5 (GLOVE) ×1
GOWN STRL REUS W/ TWL LRG LVL3 (GOWN DISPOSABLE) ×1 IMPLANT
GOWN STRL REUS W/ TWL XL LVL3 (GOWN DISPOSABLE) ×2 IMPLANT
GOWN STRL REUS W/TWL LRG LVL3 (GOWN DISPOSABLE) ×2
GOWN STRL REUS W/TWL XL LVL3 (GOWN DISPOSABLE) ×4
HANDPIECE INTERPULSE COAX TIP (DISPOSABLE) ×2
HOOD PEEL AWAY FACE SHEILD DIS (HOOD) ×4 IMPLANT
IMMOBILIZER KNEE 22 UNIV (SOFTGOODS) ×2 IMPLANT
KIT BASIN OR (CUSTOM PROCEDURE TRAY) ×2 IMPLANT
KIT ROOM TURNOVER OR (KITS) ×2 IMPLANT
MANIFOLD NEPTUNE II (INSTRUMENTS) ×2 IMPLANT
MARKER SKIN DUAL TIP RULER LAB (MISCELLANEOUS) ×2 IMPLANT
NDL 18GX1X1/2 (RX/OR ONLY) (NEEDLE) ×1 IMPLANT
NEEDLE 18GX1X1/2 (RX/OR ONLY) (NEEDLE) ×4 IMPLANT
NS IRRIG 1000ML POUR BTL (IV SOLUTION) ×2 IMPLANT
PACK TOTAL JOINT (CUSTOM PROCEDURE TRAY) ×2 IMPLANT
PAD ARMBOARD 7.5X6 YLW CONV (MISCELLANEOUS) ×3 IMPLANT
SET HNDPC FAN SPRY TIP SCT (DISPOSABLE) ×1 IMPLANT
STRIP CLOSURE SKIN 1/2X4 (GAUZE/BANDAGES/DRESSINGS) ×2 IMPLANT
SUCTION FRAZIER HANDLE 10FR (MISCELLANEOUS) ×1
SUCTION TUBE FRAZIER 10FR DISP (MISCELLANEOUS) ×1 IMPLANT
SUT MNCRL AB 3-0 PS2 18 (SUTURE) ×2 IMPLANT
SUT VIC AB 0 CT1 27 (SUTURE) ×4
SUT VIC AB 0 CT1 27XBRD ANBCTR (SUTURE) ×2 IMPLANT
SUT VIC AB 1 CT1 27 (SUTURE) ×2
SUT VIC AB 1 CT1 27XBRD ANBCTR (SUTURE) ×1 IMPLANT
SUT VIC AB 2-0 CT1 27 (SUTURE) ×4
SUT VIC AB 2-0 CT1 TAPERPNT 27 (SUTURE) ×2 IMPLANT
SYR 30ML LL (SYRINGE) ×2 IMPLANT
SYR TB 1ML LUER SLIP (SYRINGE) ×1 IMPLANT
TOWEL OR 17X24 6PK STRL BLUE (TOWEL DISPOSABLE) ×2 IMPLANT
TOWEL OR 17X26 10 PK STRL BLUE (TOWEL DISPOSABLE) ×2 IMPLANT
TRAY CATH 16FR W/PLASTIC CATH (SET/KITS/TRAYS/PACK) IMPLANT
TRAY FOLEY CATH 16FR SILVER (SET/KITS/TRAYS/PACK) ×2 IMPLANT
TUBE CONNECTING 12X1/4 (SUCTIONS) ×2 IMPLANT
YANKAUER SUCT BULB TIP NO VENT (SUCTIONS) ×2 IMPLANT

## 2016-09-11 NOTE — Anesthesia Procedure Notes (Addendum)
Anesthesia Regional Block: Adductor canal block   Pre-Anesthetic Checklist: ,, timeout performed, Correct Patient, Correct Site, Correct Laterality, Correct Procedure, Correct Position, site marked, Risks and benefits discussed,  Surgical consent,  Pre-op evaluation,  At surgeon's request and post-op pain management  Laterality: Right and Upper  Prep: chloraprep       Needles:  Injection technique: Single-shot  Needle Type: Echogenic Stimulator Needle     Needle Length: 9cm  Needle Gauge: 21   Needle insertion depth: 6 cm   Additional Needles:   Procedures: ultrasound guided,,,,,,,,  Narrative:  Start time: 09/11/2016 7:25 AM End time: 09/11/2016 7:30 AM Injection made incrementally with aspirations every 5 mL. Anesthesiologist: Leilani AbleHATCHETT, Corrina Steffensen

## 2016-09-11 NOTE — Evaluation (Signed)
Physical Therapy Evaluation Patient Details Name: Jennifer Dyer MRN: [id number] DOB: Oct 28, 1940 Today's Date: 09/11/2016   History of Present Illness  Admitted for RTKA, WBAT;  has a past medical history of  Depression; HTN (hypertension); Neuropathy (HCC); Obesity; and SUI (stress urinary incontinence, female).  has a past surgical history that includes  Ankle fracture surgery (Left, 04); Back surgery; Eye surgery (Bilateral, 14);  Total knee arthroplasty (Left, 06/09/2013)  Clinical Impression   Pt is s/p TKA resulting in the deficits listed below (see PT Problem List). Able to get up and walk in room distance with min assistance; plan is for SNF for further rehab to maximize independence and safety with mobility prior to dc home;  Pt will benefit from skilled PT to increase their independence and safety with mobility to allow discharge to the venue listed below.      Follow Up Recommendations SNF    Equipment Recommendations  Rolling walker with 5" wheels;3in1 (PT)    Recommendations for Other Services       Precautions / Restrictions Precautions Precautions: Knee;Fall Precaution Comments: Pt educated to not allow any pillow or bolster under knee for healing with optimal range of motion.  Required Braces or Orthoses: Knee Immobilizer - Right Restrictions Weight Bearing Restrictions: Yes RLE Weight Bearing: Weight bearing as tolerated      Mobility  Bed Mobility Overal bed mobility: Needs Assistance Bed Mobility: Supine to Sit     Supine to sit: Min guard     General bed mobility comments: Cues for technqiue  Transfers Overall transfer level: Needs assistance Equipment used: Rolling walker (2 wheeled) Transfers: Sit to/from Stand Sit to Stand: Min assist         General transfer comment: Min assist to steady while transitioning hands from bed to RW  Ambulation/Gait Ambulation/Gait assistance: Min assist Ambulation Distance (Feet): 12 Feet Assistive device:  Rolling walker (2 wheeled) Gait Pattern/deviations: Step-to pattern     General Gait Details: Cues for gait sequence and to activate R quad for stance stability  Stairs            Wheelchair Mobility    Modified Rankin (Stroke Patients Only)       Balance                                             Pertinent Vitals/Pain Pain Assessment: 0-10 Pain Score: 2  Pain Location: R knee Pain Descriptors / Indicators: Aching;Grimacing;Guarding Pain Intervention(s): Monitored during session    Home Living Family/patient expects to be discharged to:: Skilled nursing facility Living Arrangements: Alone                    Prior Function Level of Independence: Independent               Hand Dominance        Extremity/Trunk Assessment   Upper Extremity Assessment Upper Extremity Assessment: Overall WFL for tasks assessed    Lower Extremity Assessment Lower Extremity Assessment: RLE deficits/detail RLE Deficits / Details: Grossly decr AROM and strength, limited by some soreness postop       Communication   Communication: No difficulties  Cognition Arousal/Alertness: Awake/alert Behavior During Therapy: WFL for tasks assessed/performed Overall Cognitive Status: Within Functional Limits for tasks assessed  General Comments      Exercises     Assessment/Plan    PT Assessment Patient needs continued PT services  PT Problem List Decreased strength;Decreased range of motion;Decreased activity tolerance;Decreased balance;Decreased mobility;Decreased coordination;Decreased knowledge of use of DME;Decreased knowledge of precautions;Pain;Obesity       PT Treatment Interventions DME instruction;Gait training;Functional mobility training;Therapeutic activities;Therapeutic exercise;Balance training;Patient/family education    PT Goals (Current goals can be found in the Care Plan section)  Acute Rehab PT  Goals Patient Stated Goal: to walk without pain PT Goal Formulation: With patient Time For Goal Achievement: 09/18/16 Potential to Achieve Goals: Good    Frequency 7X/week   Barriers to discharge        Co-evaluation               End of Session Equipment Utilized During Treatment: Gait belt;Right knee immobilizer Activity Tolerance: Patient tolerated treatment well Patient left: in chair;with call bell/phone within reach Nurse Communication: Mobility status PT Visit Diagnosis: Unsteadiness on feet (R26.81);Pain Pain - Right/Left: Right Pain - part of body: Knee         Time: [id number] PT Time Calculation (min) (ACUTE ONLY): 31 min   Charges:   PT Evaluation $PT Eval Moderate Complexity: 1 Procedure PT Treatments $Gait Training: 8-22 mins   PT G Codes:         Levi AlandHolly H Jarren Dyer 09/11/2016, 4:33 PM  Van ClinesHolly [name], PT  Acute Rehabilitation Services Pager [phone] Office [phone]

## 2016-09-11 NOTE — Interval H&P Note (Signed)
.  H&P done

## 2016-09-11 NOTE — Anesthesia Postprocedure Evaluation (Addendum)
Anesthesia Post Note  Patient: Jennifer Dyer  Procedure(s) Performed: Procedure(s) (LRB): TOTAL KNEE ARTHROPLASTY (Right)  Patient location during evaluation: PACU Anesthesia Type: General and Regional Level of consciousness: awake Pain management: pain level not controlled Vital Signs Assessment: post-procedure vital signs reviewed and stable Respiratory status: spontaneous breathing Cardiovascular status: stable Postop Assessment: no signs of nausea or vomiting Anesthetic complications: no        Last Vitals:  Vitals:   09/11/16 0652 09/11/16 1001  BP: 115/64 134/78  Pulse: 68   Resp: 20 17  Temp: 36.8 C 36.7 C    Last Pain:  Vitals:   09/11/16 1001  TempSrc:   PainSc: 10-Worst pain ever   Pain Goal:                 Hayzen Lorenson JR,JOHN Shanika Levings

## 2016-09-11 NOTE — Op Note (Signed)
MRN:     [id number] DOB/AGE:    01/13/1941 / 76 y.o.       OPERATIVE REPORT    DATE OF PROCEDURE:  09/11/2016       PREOPERATIVE DIAGNOSIS:   Primary localized OA right knee      Estimated body mass index is 41.39 kg/m as calculated from the following:   Height as of this encounter: 5' 3.5" (1.613 m).   Weight as of this encounter: 237 lb 6 oz (107.7 kg).                                                        POSTOPERATIVE DIAGNOSIS:  same                                                                     PROCEDURE:  Procedure(s): TOTAL KNEE ARTHROPLASTY Using Depuy Attune RP implants #5 Femur, #4Tibia, 7mm  RP bearing, 29 Patella     SURGEON: Mate Alegria A    ASSISTANT:  Kirstin Shepperson PA-C   (Present and scrubbed throughout the case, critical for assistance with exposure, retraction, instrumentation, and closure.)         ANESTHESIA: GET with Adductor Nerve Block     TOURNIQUET TIME: 80min   COMPLICATIONS:  None     SPECIMENS: None   INDICATIONS FOR PROCEDURE: The patient has  djd right knee, varus deformities, XR shows bone on bone arthritis. Patient has failed all conservative measures including anti-inflammatory medicines, narcotics, attempts at  exercise and weight loss, cortisone injections and viscosupplementation.  Risks and benefits of surgery have been discussed, questions answered.   DESCRIPTION OF PROCEDURE: The patient identified by armband, received  right femoral nerve block and IV antibiotics, [facility]. Patient taken to the operating room, appropriate anesthetic  monitors were attached General endotracheal anesthesia induced with  the patient in supine position, Foley catheter was inserted. Tourniquet  applied high to the operative thigh. Lateral post and foot positioner  applied to the table, the lower extremity was then prepped and draped  in usual sterile fashion from the ankle to the tourniquet. Time-out procedure was  performed. The limb was wrapped with an Esmarch bandage and the tourniquet inflated to 365 mmHg. We began the operation by making the anterior midline incision starting at handbreadth above the patella going over the patella 1 cm medial to and  4 cm distal to the tibial tubercle. Small bleeders in the skin and the  subcutaneous tissue identified and cauterized. Transverse retinaculum was incised and reflected medially and a medial parapatellar arthrotomy was accomplished. the patella was everted and theprepatellar fat pad resected. The superficial medial collateral  ligament was then elevated from anterior to posterior along the proximal  flare of the tibia and anterior half of the menisci resected. The knee was hyperflexed exposing bone on bone arthritis. Peripheral and notch osteophytes as well as the cruciate ligaments were then resected. We continued to  work our way around posteriorly along the proximal tibia, and externally  rotated the tibia subluxing it  out from underneath the femur. A McHale  retractor was placed through the notch and a lateral Hohmann retractor  placed, and we then drilled through the proximal tibia in line with the  axis of the tibia followed by an intramedullary guide rod and 2-degree  posterior slope cutting guide. The tibial cutting guide was pinned into place  allowing resection of 4 mm of bone medially and about 6 mm of bone  laterally because of her varus deformity. Satisfied with the tibial resection, we then  entered the distal femur 2 mm anterior to the PCL origin with the  intramedullary guide rod and applied the distal femoral cutting guide  set at 11mm, with 5 degrees of valgus. This was pinned along the  epicondylar axis. At this point, the distal femoral cut was accomplished without difficulty. We then sized for a #5 femoral component and pinned the guide in 3 degrees of external rotation.The chamfer cutting guide was pinned into place. The anterior,  posterior, and chamfer cuts were accomplished without difficulty followed by  the  RP box cutting guide and the box cut. We also removed posterior osteophytes from the posterior femoral condyles. At this  time, the knee was brought into full extension. We checked our  extension and flexion gaps and found them symmetric at 7mm.  The patella thickness measured at 20 mm. We set the cutting guide at 15 and removed the posterior 8 mm  of the patella sized for 29 button and drilled the lollipop. The knee  was then once again hyperflexed exposing the proximal tibia. We sized for a #4 tibial base plate, applied the smokestack and the conical reamer followed by the the Delta fin keel punch. We then hammered into place the  RP trial femoral component, inserted a 1 trial bearing, trial patellar button, and took the knee through range of motion from 0-130 degrees. No thumb pressure was required for patellar  tracking. At this point, all trial components were removed, a double batch of DePuy HV cement was mixed and applied to all bony metallic mating surfaces except for the posterior condyles of the femur itself. In order, we  hammered into place the tibial tray and removed excess cement, the femoral component and removed excess cement, a 7mm  RP bearing  was inserted, and the knee brought to full extension with compression.  The patellar button was clamped into place, and excess cement  removed. While the cement cured the wound was irrigated out with normal saline solution pulse lavage.. Ligament stability and patellar tracking were checked and found to be excellent.. The parapatellar arthrotomy was closed with  #1 Vicryl suture. The subcutaneous tissue with 0 and 2-0 undyed  Vicryl suture, and 4-0 Monocryl.. A dressing of Aquaseal,  4 x 4, dressing sponges, Webril, and Ace wrap applied. Needle and sponge count were correct times 2.The patient awakened, extubated, and taken to recovery room without difficulty.  Vascular status was normal, pulses 2+ and symmetric.   Layton Tappan A 09/11/2016, 9:20 AM

## 2016-09-11 NOTE — Transfer of Care (Signed)
Immediate Anesthesia Transfer of Care Note  Patient: Jennifer FootsDiana M Dyer  Procedure(s) Performed: Procedure(s): TOTAL KNEE ARTHROPLASTY (Right)  Patient Location: PACU  Anesthesia Type:General and GA combined with regional for post-op pain  Level of Consciousness: awake, alert , oriented and patient cooperative  Airway & Oxygen Therapy: Patient Spontanous Breathing and Patient connected to face mask oxygen  Post-op Assessment: Report given to RN and Post -op Vital signs reviewed and stable  Post vital signs: Reviewed and stable  Last Vitals:  Vitals:   08/30/16 1500 09/11/16 0652  BP: 115/69 115/64  Pulse: 71 68  Resp:  20  Temp: 36.2 C 36.8 C    Last Pain:  Vitals:   09/11/16 0652  TempSrc: Oral         Complications: No apparent anesthesia complications

## 2016-09-11 NOTE — Anesthesia Procedure Notes (Signed)
Procedure Name: Intubation Date/Time: 09/11/2016 7:49 AM Performed by: Adalberto Ill Pre-anesthesia Checklist: Patient identified, Emergency Drugs available, Suction available, Patient being monitored and Timeout performed Patient Re-evaluated:Patient Re-evaluated prior to inductionOxygen Delivery Method: Circle system utilized Preoxygenation: Pre-oxygenation with 100% oxygen Intubation Type: IV induction Ventilation: Mask ventilation without difficulty Laryngoscope Size: Mac and 3 Grade View: Grade I Tube type: Oral Tube size: 7.0 mm Number of attempts: 1 Airway Equipment and Method: Stylet Placement Confirmation: ETT inserted through vocal cords under direct vision,  positive ETCO2 and breath sounds checked- equal and bilateral Secured at: 22 cm Tube secured with: Tape Dental Injury: Teeth and Oropharynx as per pre-operative assessment

## 2016-09-11 NOTE — Progress Notes (Signed)
Orthopedic Tech Progress Note Patient Details:  Jennifer Dyer 03/10/1941 [id number]  CPM Right Knee CPM Right Knee: On Right Knee Flexion (Degrees): 90 Right Knee Extension (Degrees): 0 Additional Comments: Trapeze bar and foot roll   Saul FordyceJennifer C Malayah Demuro 09/11/2016, 10:49 AM

## 2016-09-11 NOTE — Anesthesia Preprocedure Evaluation (Addendum)
Anesthesia Evaluation    Airway Mallampati: II       Dental   Pulmonary    Pulmonary exam normal        Cardiovascular hypertension, Normal cardiovascular exam     Neuro/Psych    GI/Hepatic   Endo/Other    Renal/GU      Musculoskeletal   Abdominal (+) + obese,   Peds  Hematology   Anesthesia Other Findings   Reproductive/Obstetrics                            Anesthesia Physical Anesthesia Plan  ASA: III  Anesthesia Plan: General   Post-op Pain Management:  Regional for Post-op pain   Induction: Intravenous  Airway Management Planned: Oral ETT  Additional Equipment:   Intra-op Plan:   Post-operative Plan: Extubation in OR  Informed Consent: I have reviewed the patients History and Physical, chart, labs and discussed the procedure including the risks, benefits and alternatives for the proposed anesthesia with the patient or authorized representative who has indicated his/her understanding and acceptance.     Plan Discussed with: CRNA and Surgeon  Anesthesia Plan Comments:         Anesthesia Quick Evaluation

## 2016-09-12 ENCOUNTER — Encounter (HOSPITAL_COMMUNITY): Payer: Self-pay | Admitting: Orthopedic Surgery

## 2016-09-12 LAB — BASIC METABOLIC PANEL
ANION GAP: 11 (ref 5–15)
BUN: 18 mg/dL (ref 6–20)
CHLORIDE: 106 mmol/L (ref 101–111)
CO2: 24 mmol/L (ref 22–32)
Calcium: 8.7 mg/dL — ABNORMAL LOW (ref 8.9–10.3)
Creatinine, Ser: 0.83 mg/dL (ref 0.44–1.00)
GFR calc Af Amer: >60 mL/min (ref >60)
GFR calc non Af Amer: >60 mL/min (ref >60)
GLUCOSE: 135 mg/dL — AB (ref 65–99)
POTASSIUM: 3.8 mmol/L (ref 3.5–5.1)
Sodium: 141 mmol/L (ref 135–145)

## 2016-09-12 LAB — CBC
HEMATOCRIT: 32.1 % — AB (ref 36.0–46.0)
HEMOGLOBIN: 10.6 g/dL — AB (ref 12.0–15.0)
MCH: 28.6 pg (ref 26.0–34.0)
MCHC: 33 g/dL (ref 30.0–36.0)
MCV: 86.5 fL (ref 78.0–100.0)
Platelets: 233 10*3/uL (ref 150–400)
RBC: 3.71 MIL/uL — AB (ref 3.87–5.11)
RDW: 14.9 % (ref 11.5–15.5)
WBC: 10 10*3/uL (ref 4.0–10.5)

## 2016-09-12 NOTE — NC FL2 (Signed)
[place] MEDICAID FL2 LEVEL OF CARE SCREENING TOOL     IDENTIFICATION  Patient Name: Jennifer Dyer Birthdate: 03/30/1941 Sex: female Admission Date (Current Location): 09/11/2016  [facility] [place]   Facility and Address:  [facility] [place], [address], [place], [place] [address]      Provider Number: [id number]  Attending Physician Name and Address:  Salvatore Marvelobert Wainer, MD  Relative Name and Phone Number:       Current Level of Care: Hospital Recommended Level of Care: Skilled Nursing Facility Prior Approval Number:    Date Approved/Denied:   PASRR Number: [id number]  Discharge Plan: SNF    Current Diagnoses: Patient Active Problem List   Diagnosis Date Noted  . Primary localized osteoarthritis of right knee 09/11/2016  . Post-traumatic osteoarthritis of right knee   . S/P total knee replacement, left   . Equinus deformity of foot, acquired 12/17/2013  . Plantar fasciitis, right 12/03/2013  . Metatarsal deformity 12/03/2013  . DJD (degenerative joint disease) of knee 06/09/2013  . Abnormal uterine bleeding (AUB)   . Left knee DJD   . Depression   . HTN (hypertension)   . Obesity   . SUI (stress urinary incontinence, female)     Orientation RESPIRATION BLADDER Height & Weight     Self, Time, Situation, Place  O2 (Nasal Cannula, 3L) Continent Weight: 237 lb 6 oz (107.7 kg) Height:  5' 3.5" (161.3 cm)  BEHAVIORAL SYMPTOMS/MOOD NEUROLOGICAL BOWEL NUTRITION STATUS      Continent  (Please see d/c summary)  AMBULATORY STATUS COMMUNICATION OF NEEDS Skin   Limited Assist Verbally Surgical wounds (Closed incision right knee, compression wrapped.)                       Personal Care Assistance Level of Assistance  Bathing, Feeding, Dressing Bathing Assistance: Limited assistance Feeding assistance: Independent Dressing Assistance: Limited assistance     Functional Limitations Info  Sight, Hearing, Speech Sight Info:  Adequate Hearing Info: Adequate Speech Info: Adequate    SPECIAL CARE FACTORS FREQUENCY  PT (By licensed PT), OT (By licensed OT)     PT Frequency: 7x week OT Frequency: 7x week            Contractures Contractures Info: Not present    Additional Factors Info  Code Status, Allergies Code Status Info: Full Code Allergies Info: No known allergies           Current Medications (09/12/2016):  This is the current hospital active medication list Current Facility-Administered Medications  Medication Dose Route Frequency Provider Last Rate Last Dose  . 0.9 % NaCl with KCl 20 mEq/ L  infusion   Intravenous Continuous Kirstin Shepperson, PA-C 100 mL/hr at 09/12/16 0541 100 mL/hr at 09/12/16 0541  . acetaminophen (TYLENOL) tablet 650 mg  650 mg Oral Q6H PRN Kirstin Shepperson, PA-C       Or  . acetaminophen (TYLENOL) suppository 650 mg  650 mg Rectal Q6H PRN Kirstin Shepperson, PA-C      . alum & mag hydroxide-simeth (MAALOX/MYLANTA) 200-200-20 MG/5ML suspension 30 mL  30 mL Oral Q4H PRN Kirstin Shepperson, PA-C      . [START ON 09/13/2016] amLODipine (NORVASC) tablet 5 mg  5 mg Oral Daily Kirstin Shepperson, PA-C      . calcium carbonate (OS-CAL - dosed in mg of elemental calcium) tablet 1,250 mg  1,250 mg Oral BID WC Kirstin Shepperson, PA-C   1,250 mg at  09/12/16 1003  . celecoxib (CELEBREX) capsule 100 mg  100 mg Oral BID PRN Kirstin Shepperson, PA-C      . cholecalciferol (VITAMIN D) tablet 1,000 Units  1,000 Units Oral Daily Kirstin Shepperson, PA-C   1,000 Units at 09/12/16 1003  . citalopram (CELEXA) tablet 20 mg  20 mg Oral Daily Kirstin Shepperson, PA-C   20 mg at 09/12/16 1003  . dexamethasone (DECADRON) injection 10 mg  10 mg Intravenous Q8H Kirstin Shepperson, PA-C   10 mg at 09/12/16 1003  . diphenhydrAMINE (BENADRYL) 12.5 MG/5ML elixir 12.5-25 mg  12.5-25 mg Oral Q4H PRN Kirstin Shepperson, PA-C      . docusate sodium (COLACE) capsule 100 mg  100 mg Oral BID Kirstin  Shepperson, PA-C   100 mg at 09/12/16 1004  . enoxaparin (LOVENOX) injection 30 mg  30 mg Subcutaneous Q12H Kirstin Shepperson, PA-C   30 mg at 09/12/16 1004  . fesoterodine (TOVIAZ) tablet 8 mg  8 mg Oral Daily Kirstin Shepperson, PA-C   8 mg at 09/12/16 1003  . [START ON 09/13/2016] losartan (COZAAR) tablet 100 mg  100 mg Oral Daily Salvatore Marvel, MD       And  . Melene Muller ON 09/13/2016] hydrochlorothiazide (HYDRODIURIL) tablet 25 mg  25 mg Oral Daily Salvatore Marvel, MD      . HYDROmorphone (DILAUDID) injection 1 mg  1 mg Intravenous Q2H PRN Salvatore Marvel, MD      . menthol-cetylpyridinium (CEPACOL) lozenge 3 mg  1 lozenge Oral PRN Kirstin Shepperson, PA-C       Or  . phenol (CHLORASEPTIC) mouth spray 1 spray  1 spray Mouth/Throat PRN Kirstin Shepperson, PA-C      . metoCLOPramide (REGLAN) tablet 5-10 mg  5-10 mg Oral Q8H PRN Kirstin Shepperson, PA-C       Or  . metoCLOPramide (REGLAN) injection 5-10 mg  5-10 mg Intravenous Q8H PRN Kirstin Shepperson, PA-C      . metoprolol succinate (TOPROL-XL) 24 hr tablet 25 mg  25 mg Oral Daily Kirstin Shepperson, PA-C   25 mg at 09/12/16 1003  . montelukast (SINGULAIR) tablet 10 mg  10 mg Oral QHS Kirstin Shepperson, PA-C   10 mg at 09/11/16 2042  . multivitamin with minerals tablet 1 tablet  1 tablet Oral Daily Kirstin Shepperson, PA-C   1 tablet at 09/12/16 1003  . ondansetron (ZOFRAN) tablet 4 mg  4 mg Oral Q6H PRN Kirstin Shepperson, PA-C       Or  . ondansetron (ZOFRAN) injection 4 mg  4 mg Intravenous Q6H PRN Kirstin Shepperson, PA-C      . oxyCODONE (Oxy IR/ROXICODONE) immediate release tablet 5-10 mg  5-10 mg Oral Q3H PRN Kirstin Shepperson, PA-C   10 mg at 09/11/16 1105  . pantoprazole (PROTONIX) EC tablet 40 mg  40 mg Oral Daily Kirstin Shepperson, PA-C   40 mg at 09/12/16 1003  . polyethylene glycol (MIRALAX / GLYCOLAX) packet 17 g  17 g Oral BID Kirstin Shepperson, PA-C   17 g at 09/12/16 1004     Discharge Medications: Please see discharge  summary for a list of discharge medications.  Relevant Imaging Results:  Relevant Lab Results:   Additional Information SSN: [id number]  Maree Krabbe, LCSW

## 2016-09-12 NOTE — Progress Notes (Signed)
OT NOTE  Pt is Medicare and current D/C plan is SNF. No apparent immediate acute care OT needs, therefore will defer OT to SNF. If OT eval is needed please call Acute Rehab Dept. at [phone] or text page OT at [phone].    Nakul Avino, Brynn   OTR/L Pager: [phone] Office: [phone] .   

## 2016-09-12 NOTE — Progress Notes (Signed)
Subjective: 1 Day Post-Op Procedure(s) (LRB): TOTAL KNEE ARTHROPLASTY (Right) Patient reports pain as moderate.  No nausea/vomiting, lightheadedness/dizziness.  She does have some stated chest congestion.    Objective: Vital signs in last 24 hours: Temp:  [97.8 F (36.6 C)-98.3 F (36.8 C)] 97.8 F (36.6 C) (03/20 0500) Pulse Rate:  [66-75] 75 (03/20 0500) Resp:  [16-18] 18 (03/20 0500) BP: (114-125)/(53-68) 122/64 (03/20 0500) SpO2:  [97 %-98 %] 98 % (03/20 0500)  Intake/Output from previous day: 03/19 0701 - 03/20 0700 In: 3168.3 [P.O.:240; I.V.:2828.3; IV Piggyback:100] Out: 410 [Urine:400; Blood:10] Intake/Output this shift: No intake/output data recorded.   Recent Labs  09/11/16 1207 09/12/16 0459  HGB 12.7 10.6*    Recent Labs  09/11/16 1207 09/12/16 0459  WBC 11.9* 10.0  RBC 4.33 3.71*  HCT 37.8 32.1*  PLT 236 233    Recent Labs  09/11/16 1207 09/12/16 0459  NA  --  141  K  --  3.8  CL  --  106  CO2  --  24  BUN  --  18  CREATININE 0.91 0.83  GLUCOSE  --  135*  CALCIUM  --  8.7*   No results for input(s): LABPT, INR in the last 72 hours.  Neurologically intact Neurovascular intact Sensation intact distally Intact pulses distally Dorsiflexion/Plantar flexion intact Incision: dressing C/D/I No cellulitis present Compartment soft  Assessment/Plan: 1 Day Post-Op Procedure(s) (LRB): TOTAL KNEE ARTHROPLASTY (Right) Advance diet Up with therapy D/C IV fluids Discharge to SNF once approved by insurance WBAT RLE Dry dressing change prn Chest CT revealed resolution of previously seen consolidation  Otilio SaberM Lindsey Darrin Koman 09/12/2016, 1:58 PM

## 2016-09-12 NOTE — Progress Notes (Signed)
Physical Therapy Treatment Patient Details Name: Jennifer Dyer MRN: [id number] DOB: [birth year] Today's Date: 09/12/2016    History of Present Illness Admitted for RTKA, WBAT;  has a past medical history of  Depression; HTN (hypertension); Neuropathy (HCC); Obesity; and SUI (stress urinary incontinence, female).  has a past surgical history that includes  Ankle fracture surgery (Left, 04); Back surgery; Eye surgery (Bilateral, 14);  Total knee arthroplasty (Left, 06/09/2013)    PT Comments    Patient is progressing toward mobility goals. Initiated HEP and increased gait distance this session. Continue to progress as tolerated.    Follow Up Recommendations  SNF     Equipment Recommendations  Rolling walker with 5" wheels;3in1 (PT)    Recommendations for Other Services       Precautions / Restrictions Precautions Precautions: Knee;Fall Precaution Comments: reviewed precautions  Restrictions Weight Bearing Restrictions: Yes RLE Weight Bearing: Weight bearing as tolerated    Mobility  Bed Mobility Overal bed mobility: Needs Assistance Bed Mobility: Supine to Sit     Supine to sit: Supervision     General bed mobility comments: supervision for safety; use of rail and HOB elevated  Transfers Overall transfer level: Needs assistance Equipment used: Rolling walker (2 wheeled) Transfers: Sit to/from Stand Sit to Stand: Min guard         General transfer comment: min guard for safety; cues for safe hand placement  Ambulation/Gait Ambulation/Gait assistance: Min guard Ambulation Distance (Feet): 60 Feet Assistive device: Rolling walker (2 wheeled) Gait Pattern/deviations: Step-through pattern;Decreased stance time - right;Decreased step length - left;Decreased weight shift to right Gait velocity: decreased   General Gait Details: cues for sequencing and posture; heavy reliance on RW   Stairs            Wheelchair Mobility    Modified Rankin (Stroke  Patients Only)       Balance                                    Cognition Arousal/Alertness: Awake/alert Behavior During Therapy: WFL for tasks assessed/performed Overall Cognitive Status: Within Functional Limits for tasks assessed                      Exercises Total Joint Exercises Quad Sets: AROM;Right;10 reps Heel Slides: AROM;Right;10 reps Hip ABduction/ADduction: AROM;Right;10 reps Straight Leg Raises: AROM;Right;10 reps Goniometric ROM: approx 5-75    General Comments        Pertinent Vitals/Pain Pain Assessment: Faces Faces Pain Scale: Hurts little more Pain Location: R knee Pain Descriptors / Indicators: Guarding;Sore Pain Intervention(s): Monitored during session;Premedicated before session;Repositioned    Home Living                      Prior Function            PT Goals (current goals can now be found in the care plan section) Acute Rehab PT Goals Patient Stated Goal: to walk without pain PT Goal Formulation: With patient Time For Goal Achievement: 09/18/16 Potential to Achieve Goals: Good Progress towards PT goals: Progressing toward goals    Frequency    7X/week      PT Plan Current plan remains appropriate    Co-evaluation             End of Session Equipment Utilized During Treatment: Gait belt Activity Tolerance: Patient tolerated treatment well Patient left: in  chair;with call bell/phone within reach Nurse Communication: Mobility status PT Visit Diagnosis: Unsteadiness on feet (R26.81);Pain Pain - Right/Left: Right Pain - part of body: Knee     Time: [id number] PT Time Calculation (min) (ACUTE ONLY): 34 min  Charges:  $Gait Training: 8-22 mins $Therapeutic Exercise: 8-22 mins                    G Codes:       Derek MoundKellyn R Shareena Nusz Wileen Duncanson, PTA Pager: [phone]   09/12/2016, 9:30 AM

## 2016-09-12 NOTE — Progress Notes (Signed)
Physical Therapy Treatment Patient Details Name: Jennifer Dyer MRN: [id number] DOB: [birth year] Today's Date: 09/12/2016    History of Present Illness Admitted for RTKA, WBAT;  has a past medical history of  Depression; HTN (hypertension); Neuropathy (HCC); Obesity; and SUI (stress urinary incontinence, female).  has a past surgical history that includes  Ankle fracture surgery (Left, 04); Back surgery; Eye surgery (Bilateral, 14);  Total knee arthroplasty (Left, 06/09/2013)    PT Comments    Patient with increased pain this pm but willing to participate in therapy. Pt tolerated increase in gait distance but did demo more antalgic gait pattern. Reviewed therex handout and encouraged to work on LE therex this evening when pain level decreased. RN gave pain meds during session. Continue to progress as tolerated.    Follow Up Recommendations  SNF     Equipment Recommendations  Rolling walker with 5" wheels;3in1 (PT)    Recommendations for Other Services       Precautions / Restrictions Precautions Precautions: Knee;Fall Precaution Comments: reviewed precautions  Restrictions Weight Bearing Restrictions: Yes RLE Weight Bearing: Weight bearing as tolerated    Mobility  Bed Mobility               General bed mobility comments: pt OOB in chair upon arrival  Transfers Overall transfer level: Needs assistance Equipment used: Rolling walker (2 wheeled) Transfers: Sit to/from Stand Sit to Stand: Min guard         General transfer comment: min guard for safety; cues for safe hand placement and technique  Ambulation/Gait Ambulation/Gait assistance: Min guard Ambulation Distance (Feet): 100 Feet Assistive device: Rolling walker (2 wheeled) Gait Pattern/deviations: Step-through pattern;Decreased stance time - right;Decreased step length - left;Decreased weight shift to right;Antalgic Gait velocity: decreased   General Gait Details: cues for posture and safe proximity of  RW; pt with more antalgic gait this session due to pain   Stairs            Wheelchair Mobility    Modified Rankin (Stroke Patients Only)       Balance                                    Cognition Arousal/Alertness: Awake/alert Behavior During Therapy: WFL for tasks assessed/performed Overall Cognitive Status: Within Functional Limits for tasks assessed                      Exercises Total Joint Exercises Ankle Circles/Pumps: AROM;Both;10 reps Short Arc Quad: AROM;Right;10 reps    General Comments General comments (skin integrity, edema, etc.): given therex handout and encouraged to work on this evening when pain level decreases      Pertinent Vitals/Pain Pain Assessment: 0-10 Pain Score: 7  Pain Location: R knee Pain Descriptors / Indicators: Guarding;Sore Pain Intervention(s): Limited activity within patient's tolerance;Monitored during session;Repositioned;RN gave pain meds during session;Ice applied    Home Living                      Prior Function            PT Goals (current goals can now be found in the care plan section) Acute Rehab PT Goals Patient Stated Goal: to walk without pain PT Goal Formulation: With patient Time For Goal Achievement: 09/18/16 Potential to Achieve Goals: Good Progress towards PT goals: Progressing toward goals    Frequency    7X/week  PT Plan Current plan remains appropriate    Co-evaluation             End of Session Equipment Utilized During Treatment: Gait belt Activity Tolerance: Patient tolerated treatment well Patient left: in chair;with call bell/phone within reach;with family/visitor present Nurse Communication: Mobility status PT Visit Diagnosis: Unsteadiness on feet (R26.81);Pain Pain - Right/Left: Right Pain - part of body: Knee     Time: [id number] PT Time Calculation (min) (ACUTE ONLY): 40 min  Charges:  $Gait Training: 8-22 mins $Therapeutic  Activity: 8-22 mins                    G Codes:       Derek MoundKellyn R Ainslee Sou Dajanique Robley, PTA Pager: [phone]   09/12/2016, 3:23 PM

## 2016-09-13 LAB — CBC
HCT: 31.8 % — ABNORMAL LOW (ref 36.0–46.0)
Hemoglobin: 10.5 g/dL — ABNORMAL LOW (ref 12.0–15.0)
MCH: 29.2 pg (ref 26.0–34.0)
MCHC: 33 g/dL (ref 30.0–36.0)
MCV: 88.6 fL (ref 78.0–100.0)
PLATELETS: 240 10*3/uL (ref 150–400)
RBC: 3.59 MIL/uL — AB (ref 3.87–5.11)
RDW: 15.5 % (ref 11.5–15.5)
WBC: 10.9 10*3/uL — ABNORMAL HIGH (ref 4.0–10.5)

## 2016-09-13 LAB — BASIC METABOLIC PANEL
Anion gap: 8 (ref 5–15)
BUN: 22 mg/dL — AB (ref 6–20)
CALCIUM: 9 mg/dL (ref 8.9–10.3)
CO2: 26 mmol/L (ref 22–32)
CREATININE: 0.78 mg/dL (ref 0.44–1.00)
Chloride: 109 mmol/L (ref 101–111)
GFR calc Af Amer: >60 mL/min (ref >60)
GFR calc non Af Amer: >60 mL/min (ref >60)
GLUCOSE: 131 mg/dL — AB (ref 65–99)
POTASSIUM: 4.8 mmol/L (ref 3.5–5.1)
SODIUM: 143 mmol/L (ref 135–145)

## 2016-09-13 MED ORDER — OXYCODONE HCL 5 MG PO TABS: ORAL_TABLET | ORAL | 0 refills | Status: DC

## 2016-09-13 MED ORDER — ENOXAPARIN SODIUM 30 MG/0.3ML ~~LOC~~ SOLN: 30.0000 mg | Freq: Two times a day (BID) | SUBCUTANEOUS | 0 refills | Status: DC

## 2016-09-13 MED ORDER — POLYETHYLENE GLYCOL 3350 17 G PO PACK: PACK | ORAL | 0 refills | Status: DC

## 2016-09-13 MED ORDER — DOCUSATE SODIUM 100 MG PO CAPS: ORAL_CAPSULE | ORAL | 0 refills | Status: DC

## 2016-09-13 NOTE — Clinical Social Work Placement (Signed)
   CLINICAL SOCIAL WORK PLACEMENT  NOTE  Date:  09/13/2016  Patient Details  Name: Jennifer Dyer MRN: [id number] Date of Birth: 04/15/1941  Clinical Social Work is seeking post-discharge placement for this patient at the Skilled  Nursing Facility level of care (*CSW will initial, date and re-position this form in  chart as items are completed):      Patient/family provided with [facility] Clinical [facility] Work Department's list of facilities offering this level of care within the geographic area requested by the patient (or if unable, by the patient's family).  Yes   Patient/family informed of their freedom to choose among providers that offer the needed level of care, that participate in Medicare, Medicaid or managed care program needed by the patient, have an available bed and are willing to accept the patient.      Patient/family informed of [facility]'s ownership interest in [facility] and [facility], as well as of the fact that they are under no obligation to receive care at these facilities.  PASRR submitted to EDS on       PASRR number received on 09/12/16     Existing PASRR number confirmed on       FL2 transmitted to all facilities in geographic area requested by pt/family on 09/12/16     FL2 transmitted to all facilities within larger geographic area on       Patient informed that his/her managed care company has contracts with or will negotiate with certain facilities, including the following:        Yes   Patient/family informed of bed offers received.  [facility]     Physician recommends and patient chooses bed at      [facility] on 09/13/16.  Patient to be transferred to facility by Daughter      Patient family notified on 09/13/16 of transfer.  Name of family member notified:        PHYSICIAN       Additional Comment:    _______________________________________________ Maree KrabbeBridget A Sharie Amorin,  LCSW 09/13/2016, 4:22 PM

## 2016-09-13 NOTE — Progress Notes (Signed)
Pt is [facility] for [facility]. Daughter will be providing transport to facility. Rx with pt.All personal belongings with pt.

## 2016-09-13 NOTE — Clinical Social Work Note (Signed)
Clinical Social Work Assessment  Patient Details  Name: Jennifer FootsDiana M Sitar MRN: [id number] Date of Birth: 08/07/1940  Date of referral:  09/13/16               Reason for consult:  Facility Placement                Permission sought to share information with:  Family Supports Permission granted to share information::  Yes, Verbal Permission Granted  Name::        Agency::     Relationship::     Contact Information:     Housing/Transportation Living arrangements for the past 2 months:  Single Family Home Source of Information:  Patient Patient Interpreter Needed:  None Criminal Activity/Legal Involvement Pertinent to Current Situation/Hospitalization:  No - Comment as needed Significant Relationships:  Adult Children Lives with:  Self Do you feel safe going back to the place where you live?  Yes Need for family participation in patient care:  Yes (Comment)  Care giving concerns:  No family or friends present during initial assessment.   Social Worker assessment / plan:  CSW spoke with pt at bedside to complete initial assessment. Pt lives home alone. Pt states she wants to go [facility]. Renne, RNCM has been working with pt. [facility]. Pt states her daughter will transport her.  Employment status:  Retired Database administratornsurance information:  Managed Medicare PT Recommendations:  Skilled Nursing Facility Information / Referral to community resources:  Skilled Nursing Facility  Patient/Family's Response to care:  Pt verbalized understanding of CSW role and expressed appreciation for support. Pt denies any concern regarding pt care at this time.   Patient/Family's Understanding of and Emotional Response to Diagnosis, Current Treatment, and Prognosis:  Pt understanding and realistic regarding physical limitations. Pt understands the need for SNF placement at d/c. Pt agreeable to SNF placement at d/c, at this time. Pt's responses emotionally appropriate during conversation with CSW. Pt  denies any concern regarding treatment plan at this time. CSW will continue to provide support and facilitate d/c needs.   Emotional Assessment Appearance:  Appears stated age Attitude/Demeanor/Rapport:   (patient was appropriate.) Affect (typically observed):  Accepting, Appropriate, Calm Orientation:  Oriented to Situation, Oriented to  Time, Oriented to Place, Oriented to Self Alcohol / Substance use:  Not Applicable Psych involvement (Current and /or in the community):  No (Comment)  Discharge Needs  Concerns to be addressed:  No discharge needs identified Readmission within the last 30 days:  No Current discharge risk:  Dependent with Mobility Barriers to Discharge:  Continued Medical Work up   [employer] MutualBridget A Aarnav Steagall, LCSW 09/13/2016, 4:20 PM

## 2016-09-13 NOTE — Discharge Summary (Signed)
Patient ID: Jennifer Dyer MRN: [id number] DOB/AGE: 75/08/1940 75 y.o.  Admit date: 09/11/2016 Discharge date: 09/13/2016  Admission Diagnoses:  Principal Problem:   Post-traumatic osteoarthritis of right knee Active Problems:   HTN (hypertension)   Obesity   SUI (stress urinary incontinence, female)   Depression   S/P total knee replacement, left   Primary localized osteoarthritis of right knee   Discharge Diagnoses:  Same  Past Medical History:  Diagnosis Date  . Abnormal uterine bleeding (AUB)   . Acid reflux   . Depression   . HTN (hypertension)   . Left knee DJD   . Neuropathy (HCC)   . Obesity   . Post-traumatic osteoarthritis of right knee   . S/P total knee replacement, left   . Sleep apnea    uses CPAP  . SUI (stress urinary incontinence, female)     Surgeries: Procedure(s): TOTAL KNEE ARTHROPLASTY on 09/11/2016   Consultants:   Discharged Condition: Improved  Hospital Course: Jennifer Dyer is an 76 y.o. female who was admitted 09/11/2016 for operative treatment ofPost-traumatic osteoarthritis of right knee. Patient has severe unremitting pain that affects sleep, daily activities, and work/hobbies. After pre-op clearance the patient was taken to the operating room on 09/11/2016 and underwent  Procedure(s): TOTAL KNEE ARTHROPLASTY.    Patient was given perioperative antibiotics: Anti-infectives    Start     Dose/Rate Route Frequency Ordered Stop   09/11/16 1400  ceFAZolin (ANCEF) IVPB 2g/100 mL premix     2 g 200 mL/hr over 30 Minutes Intravenous Every 6 hours 09/11/16 1057 09/11/16 2112   09/11/16 0830  ceFAZolin (ANCEF) IVPB 2g/100 mL premix     2 g 200 mL/hr over 30 Minutes Intravenous To [facility]Stay [facility] 09/08/16 1308 09/11/16 0814       Patient was given sequential compression devices, early ambulation, and chemoprophylaxis to prevent DVT.  Patient benefited maximally from hospital stay and there were no complications.    Recent vital signs:  Patient Vitals for the past 24 hrs:  BP Temp Temp src Pulse Resp SpO2  09/13/16 0413 133/69 97.9 F (36.6 C) Axillary 63 18 97 %  09/13/16 0150 124/62 98.1 F (36.7 C) Oral 73 18 99 %  09/12/16 2241 - - - 73 18 98 %  09/12/16 2011 128/68 97.9 F (36.6 C) Oral 76 18 98 %  09/12/16 1408 (!) 115/58 97.5 F (36.4 C) Oral 71 18 98 %     Recent laboratory studies:  Recent Labs  09/12/16 0459 09/13/16 0515  WBC 10.0 10.9*  HGB 10.6* 10.5*  HCT 32.1* 31.8*  PLT 233 240  NA 141 143  K 3.8 4.8  CL 106 109  CO2 24 26  BUN 18 22*  CREATININE 0.83 0.78  GLUCOSE 135* 131*  CALCIUM 8.7* 9.0     Discharge Medications:   Allergies as of 09/13/2016      Reactions   No Known Allergies       Medication List    STOP taking these medications   aspirin 325 MG EC tablet   aspirin EC 81 MG tablet     TAKE these medications   amLODipine 5 MG tablet Commonly known as:  NORVASC Take 5 mg by mouth daily.   calcium carbonate 1250 MG capsule Take 1,200 mg by mouth 2 (two) times daily with a meal.   celecoxib 100 MG capsule Commonly known as:  CELEBREX Take 100 mg by mouth 2 (two) times daily as needed for mild  pain.   cephALEXin 500 MG capsule Commonly known as:  KEFLEX Take 500 mg by mouth See admin instructions. 7 day course started 08-24-16 Taking 1 tablet 3 times daily until 08-30-16 After 08-30-16 the dose will be 1 once daily til surgery   cholecalciferol 1000 units tablet Commonly known as:  VITAMIN D Take 1,000 Units by mouth daily.   citalopram 20 MG tablet Commonly known as:  CELEXA Take 20 mg by mouth daily.   docusate sodium 100 MG capsule Commonly known as:  COLACE 1 tab 2 times a day while on narcotics.  STOOL SOFTENER   enoxaparin 30 MG/0.3ML injection Commonly known as:  LOVENOX Inject 0.3 mLs (30 mg total) into the skin every 12 (twelve) hours.   losartan-hydrochlorothiazide 100-25 MG tablet Commonly known as:  HYZAAR Take 1 tablet by mouth daily.    metoprolol succinate 25 MG 24 hr tablet Commonly known as:  TOPROL-XL 25 mg daily.   montelukast 10 MG tablet Commonly known as:  SINGULAIR Take 10 mg by mouth at bedtime.   multivitamin tablet Take 1 tablet by mouth daily.   omeprazole 40 MG capsule Commonly known as:  PRILOSEC Take 40 mg by mouth 2 (two) times daily.   oxyCODONE 5 MG immediate release tablet Commonly known as:  Oxy IR/ROXICODONE 1-2 tablets every 4-6 hrs as needed for pain   polyethylene glycol packet Commonly known as:  MIRALAX / GLYCOLAX 17grams in 6 oz of water twice a day until bowel movement.  LAXITIVE.  Restart if two days since last bowel movement     ASK your doctor about these medications   tolterodine 4 MG 24 hr capsule Commonly known as:  DETROL LA Take 4 mg by mouth daily. Ask about: Should I take this medication?       Diagnostic Studies: Dg Chest 2 View  Result Date: 09/01/2016 CLINICAL DATA:  Upper respiratory infection EXAM: CHEST  2 VIEW COMPARISON:  01/07/2016 chest radiograph. FINDINGS: Stable cardiomediastinal silhouette with normal heart size. No pneumothorax. No pleural effusion. New mild patchy opacity at the left lung base. IMPRESSION: New mild patchy opacity at the left lung base, which may represent a pneumonia. Recommend follow-up PA and lateral post treatment chest radiographs in 4-6 weeks. Electronically Signed   By: Delbert Phenix M.D.   On: 09/01/2016 16:40   Ct Chest W Contrast  Result Date: 09/08/2016 CLINICAL DATA:  Follow-up abnormal chest x-ray EXAM: CT CHEST WITH CONTRAST TECHNIQUE: Multidetector CT imaging of the chest was performed during intravenous contrast administration. CONTRAST:  ISOVUE-300 IOPAMIDOL (ISOVUE-300) INJECTION 61% COMPARISON:  09/01/2016 FINDINGS: Cardiovascular: Thoracic aorta demonstrates atherosclerotic calcifications without aneurysmal dilatation or dissection. Mild coronary calcifications are seen. No cardiac enlargement is noted. The  pulmonary artery as visualized is within normal limits. Mediastinum/Nodes: No significant hilar or mediastinal adenopathy is noted. Thoracic inlet is within normal limits. Mild tortuosity of the right common carotid artery is noted. Lungs/Pleura: The lungs are well aerated bilaterally. The previously seen opacity at the left lung base has resolved in the interval and likely represented mild infiltrate or atelectatic changes. No sizable effusion or pneumothorax is noted. No focal parenchymal nodule is seen. Upper Abdomen: Within normal limits. Musculoskeletal: Mild degenerative changes of the thoracic spine are seen. IMPRESSION: Resolution of previously seen basilar opacity on the left. No new focal abnormality is seen. Electronically Signed   By: Alcide Clever M.D.   On: 09/08/2016 07:45   Mm Screening Breast Tomo Bilateral  Result Date:  09/01/2016 CLINICAL DATA:  Screening. EXAM: 2D DIGITAL SCREENING BILATERAL MAMMOGRAM WITH CAD AND ADJUNCT TOMO COMPARISON:  Previous exam(s). ACR Breast Density Category b: There are scattered areas of fibroglandular density. FINDINGS: There are no findings suspicious for malignancy. Images were processed with CAD. IMPRESSION: No mammographic evidence of malignancy. A result letter of this screening mammogram will be mailed directly to the patient. RECOMMENDATION: Screening mammogram in one year. (Code:[id number]-B-01Y) BI-RADS CATEGORY  1: Negative. Electronically Signed   By: Amie Portland M.D.   On: 09/01/2016 13:35    Disposition: 01-Home or Self Care  Discharge Instructions    CPM    Complete by:  As directed    Continuous passive motion machine (CPM):      Use the CPM from 0 to 90 for 6 hours per day.       You may break it up into 2 or 3 sessions per day.      Use CPM for 2 weeks or until you are told to stop.   Call MD / Call 911    Complete by:  As directed    If you experience chest pain or shortness of breath, CALL 911 and be transported to the hospital emergency  room.  If you develope a fever above 101 F, pus (white drainage) or increased drainage or redness at the wound, or calf pain, call your surgeon's office.   Change dressing    Complete by:  As directed    DO NOT REMOVE BANDAGE OVER SURGICAL INCISION.  WASH WHOLE LEG INCLUDING OVER THE WATERPROOF BANDAGE WITH SOAP AND WATER EVERY DAY.   Constipation Prevention    Complete by:  As directed    Drink plenty of fluids.  Prune juice may be helpful.  You may use a stool softener, such as Colace (over the counter) 100 mg twice a day.  Use MiraLax (over the counter) for constipation as needed.   Diet - low sodium heart healthy    Complete by:  As directed    Discharge instructions    Complete by:  As directed    INSTRUCTIONS AFTER JOINT REPLACEMENT   Remove items at home which could result in a fall. This includes throw rugs or furniture in walking pathways ICE to the affected joint every three hours while awake for 30 minutes at a time, for at least the first 3-5 days, and then as needed for pain and swelling.  Continue to use ice for pain and swelling. You may notice swelling that will progress down to the foot and ankle.  This is normal after surgery.  Elevate your leg when you are not up walking on it.   Continue to use the breathing machine you got in the hospital (incentive spirometer) which will help keep your temperature down.  It is common for your temperature to cycle up and down following surgery, especially at night when you are not up moving around and exerting yourself.  The breathing machine keeps your lungs expanded and your temperature down.   DIET:  As you were doing prior to hospitalization, we recommend a well-balanced diet.  DRESSING / WOUND CARE / SHOWERING  Keep the surgical dressing until follow up.  The dressing is water proof, so you can shower without any extra covering.  IF THE DRESSING FALLS OFF or the wound gets wet inside, change the dressing with sterile gauze.  Please use  good hand washing techniques before changing the dressing.  Do not use any lotions or  creams on the incision until instructed by your surgeon.    ACTIVITY  Increase activity slowly as tolerated, but follow the weight bearing instructions below.   No driving for 6 weeks or until further direction given by your physician.  You cannot drive while taking narcotics.  No lifting or carrying greater than 10 lbs. until further directed by your surgeon. Avoid periods of inactivity such as sitting longer than an hour when not asleep. This helps prevent blood clots.  You may return to work once you are authorized by your doctor.     WEIGHT BEARING   Weight bearing as tolerated with assist device (walker, cane, etc) as directed, use it as long as suggested by your surgeon or therapist, typically at least 2-3 weeks.   EXERCISES  Results after joint replacement surgery are often greatly improved when you follow the exercise, range of motion and muscle strengthening exercises prescribed by your doctor. Safety measures are also important to protect the joint from further injury. Any time any of these exercises cause you to have increased pain or swelling, decrease what you are doing until you are comfortable again and then slowly increase them. If you have problems or questions, call your caregiver or physical therapist for advice.   Rehabilitation is important following a joint replacement. After just a few days of immobilization, the muscles of the leg can become weakened and shrink (atrophy).  These exercises are designed to build up the tone and strength of the thigh and leg muscles and to improve motion. Often times heat used for twenty to thirty minutes before working out will loosen up your tissues and help with improving the range of motion but do not use heat for the first two weeks following surgery (sometimes heat can increase post-operative swelling).   These exercises can be done on a training  (exercise) mat, on the floor, on a table or on a bed. Use whatever works the best and is most comfortable for you.    Use music or television while you are exercising so that the exercises are a pleasant break in your day. This will make your life better with the exercises acting as a break in your routine that you can look forward to.   Perform all exercises about fifteen times, three times per day or as directed.  You should exercise both the operative leg and the other leg as well.   Exercises include:  Quad Sets - Tighten up the muscle on the front of the thigh (Quad) and hold for 5-10 seconds.   Straight Leg Raises - With your knee straight (if you were given a brace, keep it on), lift the leg to 60 degrees, hold for 3 seconds, and slowly lower the leg.  Perform this exercise against resistance later as your leg gets stronger.  Leg Slides: Lying on your back, slowly slide your foot toward your buttocks, bending your knee up off the floor (only go as far as is comfortable). Then slowly slide your foot back down until your leg is flat on the floor again.  Angel Wings: Lying on your back spread your legs to the side as far apart as you can without causing discomfort.  Hamstring Strength:  Lying on your back, push your heel against the floor with your leg straight by tightening up the muscles of your buttocks.  Repeat, but this time bend your knee to a comfortable angle, and push your heel against the floor.  You may put a  pillow under the heel to make it more comfortable if necessary.   A rehabilitation program following joint replacement surgery can speed recovery and prevent re-injury in the future due to weakened muscles. Contact your doctor or a physical therapist for more information on knee rehabilitation.    CONSTIPATION  Constipation is defined medically as fewer than three stools per week and severe constipation as less than one stool per week.  Even if you have a regular bowel pattern at  home, your normal regimen is likely to be disrupted due to multiple reasons following surgery.  Combination of anesthesia, postoperative narcotics, change in appetite and fluid intake all can affect your bowels.   YOU MUST use at least one of the following options; they are listed in order of increasing strength to get the job done.  They are all available over the counter, and you may need to use some, POSSIBLY even all of these options:    Drink plenty of fluids (prune juice may be helpful) and high fiber foods Colace 100 mg by mouth twice a day  Senokot for constipation as directed and as needed Dulcolax (bisacodyl), take with full glass of water  Miralax (polyethylene glycol) once or twice a day as needed.  If you have tried all these things and are unable to have a bowel movement in the first 3-4 days after surgery call either your surgeon or your primary doctor.    If you experience loose stools or diarrhea, hold the medications until you stool forms back up.  If your symptoms do not get better within 1 week or if they get worse, check with your doctor.  If you experience "the worst abdominal pain ever" or develop nausea or vomiting, please contact the office immediately for further recommendations for treatment.   ITCHING:  If you experience itching with your medications, try taking only a single pain pill, or even half a pain pill at a time.  You can also use Benadryl over the counter for itching or also to help with sleep.   TED HOSE STOCKINGS:  Use stockings on both legs until for at least 2 weeks or as directed by physician office. They may be removed at night for sleeping.  MEDICATIONS:  See your medication summary on the "After Visit Summary" that nursing will review with you.  You may have some home medications which will be placed on hold until you complete the course of blood thinner medication.  It is important for you to complete the blood thinner medication as  prescribed.  PRECAUTIONS:  If you experience chest pain or shortness of breath - call 911 immediately for transfer to the hospital emergency department.   If you develop a fever greater that 101 F, purulent drainage from wound, increased redness or drainage from wound, foul odor from the wound/dressing, or calf pain - CONTACT YOUR SURGEON.                                                   FOLLOW-UP APPOINTMENTS:  If you do not already have a post-op appointment, please call the office for an appointment to be seen by your surgeon.  Guidelines for how soon to be seen are listed in your "After Visit Summary", but are typically between 1-4 weeks after surgery.  OTHER INSTRUCTIONS:   Knee Replacement:  Do  not place pillow under knee, focus on keeping the knee straight while resting. CPM instructions: 0-90 degrees, 2 hours in the morning, 2 hours in the afternoon, and 2 hours in the evening. Place foam block, curve side up under heel at all times except when in CPM or when walking.  DO NOT modify, tear, cut, or change the foam block in any way.  MAKE SURE YOU:  Understand these instructions.  Get help right away if you are not doing well or get worse.    Thank you for letting us be a part of your medical care team.  It is a privilege we respect greatly.  We hope these instructions will help you stay on track for a fast and full recovery!   Do not put a pillow under the knee. Place it under the heel.    Complete by:  As directed    Place gray foam block, curve side up under heel at all times except when in CPM or when walking.  DO NOT modify, tear, cut, or change in any way the gray foam block.   Increase activity slowly as tolerated    Complete by:  As directed    Patient may shower    Complete by:  As directed    Aquacel dressing is water proof    Wash over it and the whole leg with soap and water at the end of your shower   TED hose    Complete by:  As directed    Use stockings (TED hose)  for 2 weeks on both leg(s).  You may remove them at night for sleeping.      Follow-up Information    Nilda Simmer, MD Follow up on 09/25/2016.   Specialty:  Orthopedic Surgery Why:  appt time 3 pm Contact information: [address] [place] [address] [phone]            Signed: Pascal Lux 09/13/2016, 12:16 PM

## 2016-09-13 NOTE — Progress Notes (Signed)
Attempted to call report to [facility]. Was transferred twice and no one picked up line. Called facility back and left RN name and phone number for receiving nurse to call  So that she can receive report.

## 2016-09-13 NOTE — Progress Notes (Signed)
Physical Therapy Treatment Patient Details Name: Jennifer FootsDiana M Dyer MRN: [id number] DOB: 01/24/1941 Today's Date: 09/13/2016    History of Present Illness Admitted for RTKA, WBAT;  has a past medical history of  Depression; HTN (hypertension); Neuropathy (HCC); Obesity; and SUI (stress urinary incontinence, female).  has a past surgical history that includes  Ankle fracture surgery (Left, 04); Back surgery; Eye surgery (Bilateral, 14);  Total knee arthroplasty (Left, 06/09/2013)    PT Comments    Patient continues to make gradual progress toward mobility goals and is motivated to participate in therapy and return to PLOF. Continue to progress as tolerated with anticipated d/c to SNF for further skilled PT services.     Follow Up Recommendations  SNF     Equipment Recommendations  Rolling walker with 5" wheels;3in1 (PT)    Recommendations for Other Services       Precautions / Restrictions Precautions Precautions: Knee;Fall Precaution Comments: reviewed precautions  Restrictions Weight Bearing Restrictions: Yes RLE Weight Bearing: Weight bearing as tolerated    Mobility  Bed Mobility               General bed mobility comments: pt OOB in chair upon arrival  Transfers Overall transfer level: Needs assistance Equipment used: Rolling walker (2 wheeled) Transfers: Sit to/from Stand Sit to Stand: Min guard         General transfer comment: min guard for safety; carry over of safe hand placement  Ambulation/Gait Ambulation/Gait assistance: Min guard Ambulation Distance (Feet): 100 Feet Assistive device: Rolling walker (2 wheeled) Gait Pattern/deviations: Step-through pattern;Decreased stance time - right;Decreased step length - left;Decreased weight shift to right Gait velocity: decreased   General Gait Details: cues for posture, safe proximity of RW, and encouraged to attempt increased weightbearing R LE to improve L foot clearance and step length   Stairs             Wheelchair Mobility    Modified Rankin (Stroke Patients Only)       Balance                                    Cognition Arousal/Alertness: Awake/alert Behavior During Therapy: WFL for tasks assessed/performed Overall Cognitive Status: Within Functional Limits for tasks assessed                      Exercises Total Joint Exercises Quad Sets: AROM;Right;10 reps Heel Slides: AROM;Right;10 reps Hip ABduction/ADduction: AROM;Right;10 reps Straight Leg Raises: AROM;Right;10 reps Long Arc Quad: AROM;Right;10 reps Knee Flexion: AROM;Right;5 reps;Seated;Other (comment) (10 sec holds) Goniometric ROM: 90 degrees flexion in sitting    General Comments        Pertinent Vitals/Pain Pain Assessment: 0-10 Pain Score: 3  Pain Location: R knee Pain Descriptors / Indicators: Sore Pain Intervention(s): Monitored during session;Premedicated before session;Repositioned    Home Living                      Prior Function            PT Goals (current goals can now be found in the care plan section) Acute Rehab PT Goals PT Goal Formulation: With patient Time For Goal Achievement: 09/18/16 Potential to Achieve Goals: Good Progress towards PT goals: Progressing toward goals    Frequency    7X/week      PT Plan Current plan remains appropriate    Co-evaluation  End of Session Equipment Utilized During Treatment: Gait belt Activity Tolerance: Patient tolerated treatment well Patient left: in chair;with call bell/phone within reach Nurse Communication: Mobility status PT Visit Diagnosis: Unsteadiness on feet (R26.81);Pain Pain - Right/Left: Right Pain - part of body: Knee     Time: 4098-1191 PT Time Calculation (min) (ACUTE ONLY): 45 min  Charges:  $Gait Training: 8-22 mins $Therapeutic Exercise: 8-22 mins $Therapeutic Activity: 8-22 mins                    G Codes:       Derek Mound,  PTA Pager: [phone]   09/13/2016, 10:22 AM

## 2016-09-13 NOTE — Clinical Social Work Note (Signed)
Clinical Social Worker facilitated patient discharge including contacting patient family and facility to confirm patient discharge plans.  Clinical information faxed to facility and family agreeable with plan.  Patients daughter will be transporting pt to [facility] by personal vehicle .  RN to call [phone] for report prior to discharge.  Clinical Social Worker will sign off for now as social work intervention is no longer needed. Please consult us again if new need arises.  [address] Argyle StreetBridget Mayton, ConnecticutLCSWA [phone]

## 2016-09-14 ENCOUNTER — Encounter: Payer: Self-pay | Admitting: Adult Health

## 2016-09-14 ENCOUNTER — Non-Acute Institutional Stay (SKILLED_NURSING_FACILITY): Payer: Medicare PPO | Admitting: Adult Health

## 2016-09-14 DIAGNOSIS — I10 Essential (primary) hypertension: Secondary | ICD-10-CM

## 2016-09-14 DIAGNOSIS — F339 Major depressive disorder, recurrent, unspecified: Secondary | ICD-10-CM

## 2016-09-14 DIAGNOSIS — M1731 Unilateral post-traumatic osteoarthritis, right knee: Secondary | ICD-10-CM

## 2016-09-14 DIAGNOSIS — N3281 Overactive bladder: Secondary | ICD-10-CM

## 2016-09-14 DIAGNOSIS — D62 Acute posthemorrhagic anemia: Secondary | ICD-10-CM

## 2016-09-14 DIAGNOSIS — R2681 Unsteadiness on feet: Secondary | ICD-10-CM | POA: Diagnosis not present

## 2016-09-14 DIAGNOSIS — G4733 Obstructive sleep apnea (adult) (pediatric): Secondary | ICD-10-CM

## 2016-09-14 DIAGNOSIS — K5901 Slow transit constipation: Secondary | ICD-10-CM | POA: Diagnosis not present

## 2016-09-14 NOTE — Progress Notes (Signed)
DATE:  09/14/2016   MRN:  [id number]  BIRTHDAY: [birth year]  Facility:  Nursing Home Location:  [facility]  Nursing Home Room Number: [id number]  LEVEL OF CARE:  SNF [phone])  Contact Information    Name Relation Home Work [place] Son [phone]     [name] Daughter [phone]     Jennifer, Dyer [phone]        Code Status History    Date Active Date Inactive Code Status Order ID Comments User Context   09/11/2016 12:03 PM 09/13/2016  8:21 PM Full Code [id number]  Jennifer Dyer Inpatient   06/09/2013 12:40 PM 06/12/2013  8:38 PM Full Code [id number]  Jennifer Lux, PA-C Inpatient       Chief Complaint  Patient presents with  . Hospitalization Follow-up    HISTORY OF PRESENT ILLNESS:  This is a 76-YO female seen for hospital follow-up.  She was admitted to [facility] on 09/13/2016 for short-term rehabilitation following an admission at [facility] 09/11/2016-09/13/2016 for total knee arthroplasty for post-traumatic osteoarthritis of the right knee.  She has PMH of acid reflux, depression, hypertension, neuropathy and sleep apnea.  She was seen in the room today and complained of constipation and overactive bladder. She reports taking Detrol at home and would like to restart taking medication.    PAST MEDICAL HISTORY:  Past Medical History:  Diagnosis Date  . Abnormal uterine bleeding (AUB)   . Acid reflux   . Depression   . HTN (hypertension)   . Left knee DJD   . Neuropathy (HCC)   . Obesity   . Post-traumatic osteoarthritis of right knee   . S/P total knee replacement, left   . Sleep apnea    uses CPAP  . SUI (stress urinary incontinence, female)      CURRENT MEDICATIONS: Reviewed  Patient's Medications  New Prescriptions   No medications on file  Previous Medications   AMLODIPINE (NORVASC) 5 MG TABLET    Take 5 mg by mouth daily.   CALCIUM CARBONATE 1250 MG CAPSULE    Take  1,250 mg by mouth 2 (two) times daily with a meal.    CELECOXIB (CELEBREX) 100 MG CAPSULE    Take 100 mg by mouth 2 (two) times daily as needed for mild pain.    CHOLECALCIFEROL (VITAMIN D) 1000 UNITS TABLET    Take 1,000 Units by mouth daily.   CITALOPRAM (CELEXA) 20 MG TABLET    Take 20 mg by mouth daily.    DOCUSATE SODIUM (COLACE) 100 MG CAPSULE    1 tab 2 times a day while on narcotics.  STOOL SOFTENER   ENOXAPARIN (LOVENOX) 30 MG/0.3ML INJECTION    Inject 0.3 mLs (30 mg total) into the skin every 12 (twelve) hours.   LOSARTAN-HYDROCHLOROTHIAZIDE (HYZAAR) 100-25 MG TABLET    Take 1 tablet by mouth daily.   METOPROLOL SUCCINATE (TOPROL-XL) 25 MG 24 HR TABLET    25 mg daily.    MONTELUKAST (SINGULAIR) 10 MG TABLET    Take 10 mg by mouth at bedtime.    MULTIPLE VITAMIN (MULTIVITAMIN) TABLET    Take 1 tablet by mouth daily.   OMEPRAZOLE (PRILOSEC) 40 MG CAPSULE    Take 40 mg by mouth 2 (two) times daily.   OXYCODONE (OXY IR/ROXICODONE) 5 MG IMMEDIATE RELEASE TABLET    1-2 tablets every 4-6 hrs as needed for pain   POLYETHYLENE GLYCOL (MIRALAX / GLYCOLAX) PACKET  17grams in 6 oz of water twice a day until bowel movement.  LAXITIVE.  Restart if two days since last bowel movement  Modified Medications   No medications on file  Discontinued Medications   CEPHALEXIN (KEFLEX) 500 MG CAPSULE    Take 500 mg by mouth See admin instructions. 7 day course started 08-24-16 Taking 1 tablet 3 times daily until 08-30-16 After 08-30-16 the dose will be 1 once daily til surgery   LOSARTAN-HYDROCHLOROTHIAZIDE (HYZAAR) 100-25 MG TABLET    Take 1 tablet by mouth daily.      Allergies  Allergen Reactions  . No Known Allergies      REVIEW OF SYSTEMS:  GENERAL: no change in appetite, no fatigue, no weight changes, no fever, chills or weakness EYES: Denies change in vision, dry eyes, eye pain, itching or discharge EARS: Denies change in hearing, ringing in ears, or earache NOSE: Denies nasal congestion  or epistaxis MOUTH and THROAT: Denies oral discomfort, gingival pain or bleeding, pain from teeth or hoarseness   RESPIRATORY: no cough, SOB, DOE, wheezing, hemoptysis CARDIAC: no chest pain, edema or palpitations GI: no abdominal pain, diarrhea, heart burn, nausea or vomiting, + constipation GU: Complains of frequency  PSYCHIATRIC: Denies feeling of depression or anxiety. No report of hallucinations, insomnia, paranoia, or agitation    PHYSICAL EXAMINATION  GENERAL APPEARANCE: Well nourished. In no acute distress. Morbidly obese SKIN:  Right knee has Aquacel dressing, no erythema HEAD: Normal in size and contour. No evidence of trauma EYES: Lids open and close normally. No blepharitis, entropion or ectropion. PERRL. Conjunctivae are clear and sclerae are white. Lenses are without opacity EARS: Pinnae are normal. Patient hears normal voice tunes of the examiner MOUTH and THROAT: Lips are without lesions. Oral mucosa is moist and without lesions. Tongue is normal in shape, size, and color and without lesions NECK: supple, trachea midline, no neck masses, no thyroid tenderness, no thyromegaly LYMPHATICS: no LAN in the neck, no supraclavicular LAN RESPIRATORY: breathing is even & unlabored, BS CTAB CARDIAC: RRR, no murmur,no extra heart sounds, no edema GI: abdomen soft, normal BS, no masses, no tenderness, no hepatomegaly, no splenomegaly EXTREMITIES:  Able to move 4 extremities PSYCHIATRIC: Alert and oriented X 3. Affect and behavior are appropriate   LABS/RADIOLOGY: Labs reviewed: Basic Metabolic Panel:  Recent Labs  16/10/96 1500 09/11/16 1207 09/12/16 0459 09/13/16 0515  NA 142  --  141 143  K 3.1*  --  3.8 4.8  CL 109  --  106 109  CO2 25  --  24 26  GLUCOSE 87  --  135* 131*  BUN 15  --  18 22*  CREATININE 0.80 0.91 0.83 0.78  CALCIUM 9.3  --  8.7* 9.0   Liver Function Tests:  Recent Labs  09/01/16 1500  AST 15  ALT 13*  ALKPHOS 68  BILITOT 1.0  PROT 6.8    ALBUMIN 3.8   CBC:  Recent Labs  09/01/16 1500 09/11/16 1207 09/12/16 0459 09/13/16 0515  WBC 7.9 11.9* 10.0 10.9*  NEUTROABS 5.0  --   --   --   HGB 13.5 12.7 10.6* 10.5*  HCT 40.2 37.8 32.1* 31.8*  MCV 86.6 87.3 86.5 88.6  PLT 295 236 233 240    Dg Chest 2 View  Result Date: 09/01/2016 CLINICAL DATA:  Upper respiratory infection EXAM: CHEST  2 VIEW COMPARISON:  01/07/2016 chest radiograph. FINDINGS: Stable cardiomediastinal silhouette with normal heart size. No pneumothorax. No pleural effusion. New mild patchy  opacity at the left lung base. IMPRESSION: New mild patchy opacity at the left lung base, which may represent a pneumonia. Recommend follow-up PA and lateral post treatment chest radiographs in 4-6 weeks. Electronically Signed   By: Delbert PhenixJason A Poff M.D.   On: 09/01/2016 16:40   Ct Chest W Contrast  Result Date: 09/08/2016 CLINICAL DATA:  Follow-up abnormal chest x-ray EXAM: CT CHEST WITH CONTRAST TECHNIQUE: Multidetector CT imaging of the chest was performed during intravenous contrast administration. CONTRAST:  100mL ISOVUE-300 IOPAMIDOL (ISOVUE-300) INJECTION 61% COMPARISON:  09/01/2016 FINDINGS: Cardiovascular: Thoracic aorta demonstrates atherosclerotic calcifications without aneurysmal dilatation or dissection. Mild coronary calcifications are seen. No cardiac enlargement is noted. The pulmonary artery as visualized is within normal limits. Mediastinum/Nodes: No significant hilar or mediastinal adenopathy is noted. Thoracic inlet is within normal limits. Mild tortuosity of the right common carotid artery is noted. Lungs/Pleura: The lungs are well aerated bilaterally. The previously seen opacity at the left lung base has resolved in the interval and likely represented mild infiltrate or atelectatic changes. No sizable effusion or pneumothorax is noted. No focal parenchymal nodule is seen. Upper Abdomen: Within normal limits. Musculoskeletal: Mild degenerative changes of the  thoracic spine are seen. IMPRESSION: Resolution of previously seen basilar opacity on the left. No new focal abnormality is seen. Electronically Signed   By: Alcide CleverMark  Lukens M.D.   On: 09/08/2016 07:45   Mm Screening Breast Tomo Bilateral  Result Date: 09/01/2016 CLINICAL DATA:  Screening. EXAM: 2D DIGITAL SCREENING BILATERAL MAMMOGRAM WITH CAD AND ADJUNCT TOMO COMPARISON:  Previous exam(s). ACR Breast Density Category b: There are scattered areas of fibroglandular density. FINDINGS: There are no findings suspicious for malignancy. Images were processed with CAD. IMPRESSION: No mammographic evidence of malignancy. A result letter of this screening mammogram will be mailed directly to the patient. RECOMMENDATION: Screening mammogram in one year. (Code:SM-B-01Y) BI-RADS CATEGORY  1: Negative. Electronically Signed   By: Amie Portlandavid  Ormond M.D.   On: 09/01/2016 13:35    ASSESSMENT/PLAN:  Unsteady gait - for rehabilitation, PT and OT, for therapeutic strengthening exercises; fall precautions  Posttraumatic osteoarthritis of right knee - S/P right total knee arthroplasty on 09/11/16, follow-up with orthopedics, Dr. Salvatore Marvelobert Wainer, on 09/25/16; continue Lovenox 30 mg subcutaneous every 12 hours X 15 days for DVT prophylaxis; oxycodone 5 mg 1-2 tabs by mouth every 6 hours when necessary for pain  Hypertension - continue losartan-HCTZ 100-25 mg 1 tab by mouth daily, Toprol-XL 25 mg 1 tab by mouth daily and Norvasc 5 mg 1 tab by mouth daily; BP/HR twice a day 1 week; check BMP  Constipation - DC Colace, start senna S 2 tabs by mouth twice a day, MiraLAX 17 g by mouth twice a day and Dulcolax 10 mg suppository 1 rectally daily when necessary  Anemia, acute blood loss - check CBC Lab Results  Component Value Date   HGB 10.5 (L) 09/13/2016    Major depression - continue citalopram 20 mg 1 tab by mouth daily  Obstructive sleep apnea - continue CPAP at at bedtime  Overactive bladder - start Tolterodine  ER 4 mg 1  by mouth daily     Goals of care:  Short-term rehabilitation    Tenea Sens C. Medina-Vargas - NP    [facility] [phone]

## 2016-09-15 ENCOUNTER — Non-Acute Institutional Stay (SKILLED_NURSING_FACILITY): Payer: Medicare PPO | Admitting: Internal Medicine

## 2016-09-15 ENCOUNTER — Encounter: Payer: Self-pay | Admitting: Internal Medicine

## 2016-09-15 DIAGNOSIS — K219 Gastro-esophageal reflux disease without esophagitis: Secondary | ICD-10-CM | POA: Diagnosis not present

## 2016-09-15 DIAGNOSIS — I10 Essential (primary) hypertension: Secondary | ICD-10-CM

## 2016-09-15 DIAGNOSIS — M1731 Unilateral post-traumatic osteoarthritis, right knee: Secondary | ICD-10-CM | POA: Diagnosis not present

## 2016-09-15 DIAGNOSIS — D500 Iron deficiency anemia secondary to blood loss (chronic): Secondary | ICD-10-CM

## 2016-09-15 DIAGNOSIS — D72829 Elevated white blood cell count, unspecified: Secondary | ICD-10-CM

## 2016-09-15 DIAGNOSIS — K5901 Slow transit constipation: Secondary | ICD-10-CM | POA: Diagnosis not present

## 2016-09-15 DIAGNOSIS — R2681 Unsteadiness on feet: Secondary | ICD-10-CM

## 2016-09-15 DIAGNOSIS — F32A Depression, unspecified: Secondary | ICD-10-CM

## 2016-09-15 DIAGNOSIS — N393 Stress incontinence (female) (male): Secondary | ICD-10-CM | POA: Diagnosis not present

## 2016-09-15 DIAGNOSIS — F329 Major depressive disorder, single episode, unspecified: Secondary | ICD-10-CM | POA: Diagnosis not present

## 2016-09-15 NOTE — Progress Notes (Signed)
LOCATION: [facility]  PCP: Brooke Bonito, MD   Code Status: Full Code  Goals of care: Advanced Directive information Advanced Directives 09/01/2016  Does Patient Have a Medical Advance Directive? Yes  Type of Estate agent of [place];Living will  Does patient want to make changes to medical advance directive? No - Patient declined  Copy of Healthcare Power of Attorney in Chart? No - copy requested  Pre-existing out of facility DNR order (yellow form or pink MOST form) -       Extended Emergency Contact Information Primary Emergency Contact: Nasworthy,Joseph Address: [address]          [place], [place] [address] [place] Home Phone: [phone] Relation: Son Secondary Emergency Contact: Maryclare [place] Home Phone: [phone] Relation: Daughter   Allergies  Allergen Reactions  . No Known Allergies     Chief Complaint  Patient presents with  . New Admit To SNF    New Admission Visit      HPI:  Patient is a 75 y.o. female seen today for short term rehabilitation post hospital admission from 09/11/2016-09/13/2016 with posttraumatic osteoarthritis of right knee. She underwent right total knee arthroplasty. She has medical history of obesity, hypertension, depression among others. She is seen in her room today.  Review of Systems:  Constitutional: Negative for fever, chills, diaphoresis.  HENT: Negative for headache,nasal discharge, sore throat, difficulty swallowing. Positive for congestion.  Eyes: Negative for eye pain, blurred vision, double vision and discharge.  Respiratory: Negative for shortness of breath and wheezing. Positive for cough.  Cardiovascular: Negative for chest pain, palpitations, leg swelling.  Gastrointestinal: Negative for nausea, vomiting,loss of appetite, melena, diarrhea and constipation. Positive for heartburn and feeling bloated. Last bowel movement was Sunday. At home  patient would have a bowel movement every day. Passing gas.  Genitourinary: Negative for dysuria and flank pain. Positive for incontinence.  Musculoskeletal: Negative for back pain, fall in the facility.  Skin: Negative for itching, rash.  Neurological: Negative for dizziness. Psychiatric/Behavioral: Negative for depression   Past Medical History:  Diagnosis Date  . Abnormal uterine bleeding (AUB)   . Acid reflux   . Depression   . HTN (hypertension)   . Left knee DJD   . Neuropathy (HCC)   . Obesity   . Post-traumatic osteoarthritis of right knee   . S/P total knee replacement, left   . Sleep apnea    uses CPAP  . SUI (stress urinary incontinence, female)    Past Surgical History:  Procedure Laterality Date  . ANKLE FRACTURE SURGERY Left 04   fusion  . APPENDECTOMY    . BACK SURGERY    . BLADDER SURGERY    . BREAST EXCISIONAL BIOPSY Left 1987  . EYE SURGERY Bilateral 14   catarcts  . KNEE ARTHROSCOPY Left 12/2011  . LUMBAR DISC SURGERY  12/94  . TOTAL KNEE ARTHROPLASTY Left 06/09/2013   DR Thurston Hole  . TOTAL KNEE ARTHROPLASTY Left 06/09/2013   Procedure: TOTAL KNEE ARTHROPLASTY;  Surgeon: Nilda Simmer, MD;  Location: [facility] OR;  Service: Orthopedics;  Laterality: Left;  . TOTAL KNEE ARTHROPLASTY Right 09/11/2016   Procedure: TOTAL KNEE ARTHROPLASTY;  Surgeon: Salvatore Marvel, MD;  Location: [facility] OR;  Service: Orthopedics;  Laterality: Right;  . TUBAL LIGATION Bilateral    Social History:   reports that she has never smoked. She has never used smokeless tobacco. She reports that she does not drink alcohol or use  drugs.  Family History  Problem Relation Age of Onset  . Heart disease Mother   . Heart attack Mother   . Stroke Mother   . Heart disease Father   . Heart attack Father   . Hypertension    . Clotting disorder Sister     sister died of a PE post op    Medications: Allergies as of 09/15/2016      Reactions   No Known Allergies       Medication List         Accurate as of 09/15/16 11:14 AM. Always use your most recent med list.          amLODipine 5 MG tablet Commonly known as:  NORVASC Take 5 mg by mouth daily.   bisacodyl 10 MG suppository Commonly known as:  DULCOLAX Place 10 mg rectally daily as needed for moderate constipation.   calcium carbonate 1250 MG capsule Take 1,250 mg by mouth 2 (two) times daily with a meal.   cholecalciferol 1000 units tablet Commonly known as:  VITAMIN D Take 1,000 Units by mouth daily.   citalopram 20 MG tablet Commonly known as:  CELEXA Take 20 mg by mouth daily.   docusate sodium 100 MG capsule Commonly known as:  COLACE 1 tab 2 times a day while on narcotics.  STOOL SOFTENER   enoxaparin 30 MG/0.3ML injection Commonly known as:  LOVENOX Inject 0.3 mLs (30 mg total) into the skin every 12 (twelve) hours.   losartan-hydrochlorothiazide 100-25 MG tablet Commonly known as:  HYZAAR Take 1 tablet by mouth daily.   metoprolol succinate 25 MG 24 hr tablet Commonly known as:  TOPROL-XL 25 mg daily.   montelukast 10 MG tablet Commonly known as:  SINGULAIR Take 10 mg by mouth at bedtime.   multivitamin tablet Take 1 tablet by mouth daily.   NON FORMULARY CPAP to applied during the hours of sleep   omeprazole 40 MG capsule Commonly known as:  PRILOSEC Take 40 mg by mouth 2 (two) times daily.   oxycodone 5 MG capsule Commonly known as:  OXY-IR Take 5-10 mg by mouth every 6 (six) hours as needed.   polyethylene glycol packet Commonly known as:  MIRALAX / GLYCOLAX Take 17 g by mouth 2 (two) times daily.   sennosides-docusate sodium 8.6-50 MG tablet Commonly known as:  SENOKOT-S Take 2 tablets by mouth 2 (two) times daily.   tolterodine 4 MG 24 hr capsule Commonly known as:  DETROL LA Take 4 mg by mouth daily.       Immunizations: Immunization History  Administered Date(s) Administered  . H1N1 04/27/2008  . Influenza-Unspecified 04/09/2015  . PPD Test 09/13/2016  .  Pneumococcal Conjugate-13 07/28/2013  . Pneumococcal Polysaccharide-23 08/17/2006  . Tdap 12/06/2012  . Tetanus 07/28/2002  . Zoster 09/05/2006     Physical Exam: Vitals:   09/15/16 1105  BP: (!) 168/82  Pulse: 88  Resp: 18  Temp: 97.5 F (36.4 C)  TempSrc: Oral  SpO2: 95%  Weight: 237 lb 4.8 oz (107.6 kg)  Height: 5' 3.5" (1.613 m)   Body mass index is 41.38 kg/m.  General- elderly female, Morbidly obese, in no acute distress Head- normocephalic, atraumatic Nose- no nasal discharge Throat- moist mucus membrane Eyes- PERRLA, EOMI, no pallor, no icterus, no discharge, normal conjunctiva, normal sclera Neck- no cervical lymphadenopathy Cardiovascular- normal s1,s2, no murmur Respiratory- bilateral clear to auscultation, no wheeze, no rhonchi, no crackles, no use of accessory muscles Abdomen- bowel sounds present,  soft, non tender, no guarding or rigidity Musculoskeletal- able to move all 4 extremities, limited right knee range of motion, trace leg edema Neurological- alert and oriented to person, place and time Skin- warm and dry, right knee surgical incision with Aquacel dressing in place, dressing clean and dry, bruise to left hand Psychiatry- normal mood and affect    Labs reviewed: Basic Metabolic Panel:  Recent Labs  16/10/96 1500 09/11/16 1207 09/12/16 0459 09/13/16 0515  NA 142  --  141 143  K 3.1*  --  3.8 4.8  CL 109  --  106 109  CO2 25  --  24 26  GLUCOSE 87  --  135* 131*  BUN 15  --  18 22*  CREATININE 0.80 0.91 0.83 0.78  CALCIUM 9.3  --  8.7* 9.0   Liver Function Tests:  Recent Labs  09/01/16 1500  AST 15  ALT 13*  ALKPHOS 68  BILITOT 1.0  PROT 6.8  ALBUMIN 3.8   No results for input(s): LIPASE, AMYLASE in the last 8760 hours. No results for input(s): AMMONIA in the last 8760 hours. CBC:  Recent Labs  09/01/16 1500 09/11/16 1207 09/12/16 0459 09/13/16 0515  WBC 7.9 11.9* 10.0 10.9*  NEUTROABS 5.0  --   --   --   HGB 13.5  12.7 10.6* 10.5*  HCT 40.2 37.8 32.1* 31.8*  MCV 86.6 87.3 86.5 88.6  PLT 295 236 233 240   Cardiac Enzymes: No results for input(s): CKTOTAL, CKMB, CKMBINDEX, TROPONINI in the last 8760 hours. BNP: Invalid input(s): POCBNP CBG: No results for input(s): GLUCAP in the last 8760 hours.  Radiological Exams: Dg Chest 2 View  Result Date: 09/01/2016 CLINICAL DATA:  Upper respiratory infection EXAM: CHEST  2 VIEW COMPARISON:  01/07/2016 chest radiograph. FINDINGS: Stable cardiomediastinal silhouette with normal heart size. No pneumothorax. No pleural effusion. New mild patchy opacity at the left lung base. IMPRESSION: New mild patchy opacity at the left lung base, which may represent a pneumonia. Recommend follow-up PA and lateral post treatment chest radiographs in 4-6 weeks. Electronically Signed   By: Delbert Phenix M.D.   On: 09/01/2016 16:40   Ct Chest W Contrast  Result Date: 09/08/2016 CLINICAL DATA:  Follow-up abnormal chest x-ray EXAM: CT CHEST WITH CONTRAST TECHNIQUE: Multidetector CT imaging of the chest was performed during intravenous contrast administration. CONTRAST:  ISOVUE-300 IOPAMIDOL (ISOVUE-300) INJECTION 61% COMPARISON:  09/01/2016 FINDINGS: Cardiovascular: Thoracic aorta demonstrates atherosclerotic calcifications without aneurysmal dilatation or dissection. Mild coronary calcifications are seen. No cardiac enlargement is noted. The pulmonary artery as visualized is within normal limits. Mediastinum/Nodes: No significant hilar or mediastinal adenopathy is noted. Thoracic inlet is within normal limits. Mild tortuosity of the right common carotid artery is noted. Lungs/Pleura: The lungs are well aerated bilaterally. The previously seen opacity at the left lung base has resolved in the interval and likely represented mild infiltrate or atelectatic changes. No sizable effusion or pneumothorax is noted. No focal parenchymal nodule is seen. Upper Abdomen: Within normal limits.  Musculoskeletal: Mild degenerative changes of the thoracic spine are seen. IMPRESSION: Resolution of previously seen basilar opacity on the left. No new focal abnormality is seen. Electronically Signed   By: Alcide Clever M.D.   On: 09/08/2016 07:45   Mm Screening Breast Tomo Bilateral  Result Date: 09/01/2016 CLINICAL DATA:  Screening. EXAM: 2D DIGITAL SCREENING BILATERAL MAMMOGRAM WITH CAD AND ADJUNCT TOMO COMPARISON:  Previous exam(s). ACR Breast Density Category b: There are scattered areas of  fibroglandular density. FINDINGS: There are no findings suspicious for malignancy. Images were processed with CAD. IMPRESSION: No mammographic evidence of malignancy. A result letter of this screening mammogram will be mailed directly to the patient. RECOMMENDATION: Screening mammogram in one year. (Code:SM-B-01Y) BI-RADS CATEGORY  1: Negative. Electronically Signed   By: Amie Portlandavid  Ormond M.D.   On: 09/01/2016 13:35    Assessment/Plan  Unsteady gait With recent right knee surgery.Will have patient work with PT/OT as tolerated to regain strength and restore function.  Fall precautions are in place.  Right knee osteoarthritis Post traumatic right knee osteoarthritis and she is status post right total knee arthroplasty. Will need follow-up with orthopedic. Continue Lovenox for DVT prophylaxis. Continue oxycodone IR 5 mg 1-2 tablet every 6 hours as needed for pain.Will have her work with physical therapy and occupational therapy team to help with gait training and muscle strengthening exercises.fall precautions. Skin care. Encourage to be out of bed. Continue her calcium and vitamin D supplement.  Acute blood loss anemia Post op. Check CBC.  Leukocytosis No signs of infection. Afebrile. Likely reactive leukocytosis. Monitor WBC and temperature curve.  Constipation Currently on Dulcolax suppository on a needed basis with Colace 100 mg twice a day, MiraLAX twice a day and senna S2 tablets twice a day. Hydration  to be maintained. Dulcolax suppository 1 tonight. Monitor bowel movement.  Chronic depression Mood has been stable, continue citalopram 20 mg daily for now and monitor.   Hypertension Monitor blood pressure reading. Continue Norvasc 5 mg daily, Toprol XL 25 mg daily along with losartan-hydrochlorothiazide 100-25 milligrams daily.  Urinary incontinence Continue Detrol 4 mg daily and monitor  Gastroesophageal reflux disease Patient does complain of occasional symptom. Advised to sit upright post meals and to avoid late meals. Continue omeprazole 40 mg twice a day and monitor.   Goals of care: short term rehabilitation   Labs/tests ordered: CBC, BMP  Family/ staff Communication: reviewed care plan with patient and nursing supervisor  I have spent greater than 50 minutes for this encounter which includes reviewing hospital records, addressing above mentioned concerns, reviewing care plan with patient, answering patient's concerns and counseling her.     Oneal GroutMAHIMA Franklyn Cafaro, MD Internal Medicine [facility] [address] [place], [place] [address] Cell Phone (Monday-Friday 8 am - 5 pm): [phone] On Call: [phone] and follow prompts after 5 pm and on weekends Office Phone: [phone] Office Fax: [phone]

## 2016-09-18 LAB — CBC AND DIFFERENTIAL
HCT: 35 % — AB (ref 36–46)
HEMOGLOBIN: 11.7 g/dL — AB (ref 12.0–16.0)
NEUTROS ABS: 5 /uL
PLATELETS: 329 10*3/uL (ref 150–399)
WBC: 8.4 10*3/mL

## 2016-09-18 LAB — BASIC METABOLIC PANEL
BUN: 18 mg/dL (ref 4–21)
Creatinine: 0.8 mg/dL (ref 0.5–1.1)
GLUCOSE: 98 mg/dL
Potassium: 4 mmol/L (ref 3.4–5.3)
Sodium: 144 mmol/L (ref 137–147)

## 2016-09-27 ENCOUNTER — Encounter: Payer: Self-pay | Admitting: Adult Health

## 2016-09-27 ENCOUNTER — Non-Acute Institutional Stay (SKILLED_NURSING_FACILITY): Payer: Medicare PPO | Admitting: Adult Health

## 2016-09-27 DIAGNOSIS — N3281 Overactive bladder: Secondary | ICD-10-CM

## 2016-09-27 DIAGNOSIS — M1731 Unilateral post-traumatic osteoarthritis, right knee: Secondary | ICD-10-CM

## 2016-09-27 DIAGNOSIS — G4733 Obstructive sleep apnea (adult) (pediatric): Secondary | ICD-10-CM | POA: Diagnosis not present

## 2016-09-27 DIAGNOSIS — K5901 Slow transit constipation: Secondary | ICD-10-CM | POA: Diagnosis not present

## 2016-09-27 DIAGNOSIS — F329 Major depressive disorder, single episode, unspecified: Secondary | ICD-10-CM

## 2016-09-27 DIAGNOSIS — K219 Gastro-esophageal reflux disease without esophagitis: Secondary | ICD-10-CM | POA: Diagnosis not present

## 2016-09-27 DIAGNOSIS — N390 Urinary tract infection, site not specified: Secondary | ICD-10-CM

## 2016-09-27 DIAGNOSIS — F32A Depression, unspecified: Secondary | ICD-10-CM

## 2016-09-27 DIAGNOSIS — R2681 Unsteadiness on feet: Secondary | ICD-10-CM

## 2016-09-27 DIAGNOSIS — I10 Essential (primary) hypertension: Secondary | ICD-10-CM | POA: Diagnosis not present

## 2016-09-27 DIAGNOSIS — D500 Iron deficiency anemia secondary to blood loss (chronic): Secondary | ICD-10-CM | POA: Diagnosis not present

## 2016-09-27 NOTE — Progress Notes (Signed)
DATE:  09/27/2016   MRN:  [id number]  BIRTHDAY: December 17, 1940  Facility:  Nursing Home Location:  [facility]  Nursing Home Room Number: [id number]  LEVEL OF CARE:  SNF [phone])  Contact Information    Name Relation Home Work [place] Son [phone]     [name] Daughter [phone]     [place], [place] [phone]        Code Status History    Date Active Date Inactive Code Status Order ID Comments User Context   09/11/2016 12:03 PM 09/13/2016  8:21 PM Full Code [id number]  Tish Frederickson Inpatient   06/09/2013 12:40 PM 06/12/2013  8:38 PM Full Code [id number]  Pascal Lux, PA-C Inpatient       Chief Complaint  Patient presents with  . Discharge Note    HISTORY OF PRESENT ILLNESS:  This is a 76-YO female who is for discharge home and will have outpatient rehabilitation.  She was admitted to [facility] on 09/13/2016 for short-term rehabilitation following an admission at [facility] 09/11/2016-09/13/2016 for total knee arthroplasty for post-traumatic osteoarthritis of the right knee.  She has PMH of acid reflux, depression, hypertension, neuropathy and sleep apnea.  Patient was admitted to this facility for short-term rehabilitation after the patient's recent hospitalization.  Patient has completed SNF rehabilitation and therapy has cleared the patient for discharge.    PAST MEDICAL HISTORY:  Past Medical History:  Diagnosis Date  . Abnormal uterine bleeding (AUB)   . Acid reflux   . Depression   . HTN (hypertension)   . Left knee DJD   . Neuropathy (HCC)   . Obesity   . Post-traumatic osteoarthritis of right knee   . S/P total knee replacement, left   . Sleep apnea    uses CPAP  . SUI (stress urinary incontinence, female)      CURRENT MEDICATIONS: Reviewed  Patient's Medications  New Prescriptions   No medications on file  Previous Medications   ACETAMINOPHEN (TYLENOL) 325 MG TABLET     Take 650 mg by mouth 3 (three) times daily. 0600, 1400, 2200   AMLODIPINE (NORVASC) 5 MG TABLET    Take 5 mg by mouth daily.   BISACODYL (DULCOLAX) 10 MG SUPPOSITORY    Place 10 mg rectally daily as needed for moderate constipation.   CALCIUM CARBONATE 1250 MG CAPSULE    Take 1,250 mg by mouth 2 (two) times daily with a meal.    CHOLECALCIFEROL (VITAMIN D) 1000 UNITS TABLET    Take 1,000 Units by mouth daily.   CIPROFLOXACIN (CIPRO) 500 MG TABLET    Take 500 mg by mouth 2 (two) times daily.   CITALOPRAM (CELEXA) 20 MG TABLET    Take 20 mg by mouth daily.    ENOXAPARIN (LOVENOX) 30 MG/0.3ML INJECTION    Inject 0.3 mLs (30 mg total) into the skin every 12 (twelve) hours.   LOSARTAN-HYDROCHLOROTHIAZIDE (HYZAAR) 100-25 MG TABLET    Take 1 tablet by mouth daily.   METHOCARBAMOL (ROBAXIN) 500 MG TABLET    Take 500 mg by mouth every 8 (eight) hours as needed for muscle spasms.   METOPROLOL SUCCINATE (TOPROL-XL) 25 MG 24 HR TABLET    25 mg daily.    MONTELUKAST (SINGULAIR) 10 MG TABLET    Take 10 mg by mouth at bedtime.    MULTIPLE VITAMIN (MULTIVITAMIN) TABLET    Take 1 tablet by mouth daily.   NON FORMULARY  CPAP to applied during the hours of sleep   OMEPRAZOLE (PRILOSEC) 40 MG CAPSULE    Take 40 mg by mouth 2 (two) times daily.    OXYCODONE (OXY-IR) 5 MG CAPSULE    Take 5-10 mg by mouth every 6 (six) hours as needed.   POLYETHYLENE GLYCOL (MIRALAX / GLYCOLAX) PACKET    Take 17 g by mouth 2 (two) times daily.   SACCHAROMYCES BOULARDII (FLORASTOR) 250 MG CAPSULE    Take 250 mg by mouth 2 (two) times daily.   SENNOSIDES-DOCUSATE SODIUM (SENOKOT-S) 8.6-50 MG TABLET    Take 2 tablets by mouth 2 (two) times daily.   TOLTERODINE (DETROL LA) 4 MG 24 HR CAPSULE    Take 4 mg by mouth daily.  Modified Medications   No medications on file  Discontinued Medications   DOCUSATE SODIUM (COLACE) 100 MG CAPSULE    1 tab 2 times a day while on narcotics.  STOOL SOFTENER     Allergies  Allergen Reactions  .  No Known Allergies      REVIEW OF SYSTEMS:  GENERAL: no change in appetite, no fatigue, no weight changes, no fever, chills or weakness EYES: Denies change in vision, dry eyes, eye pain, itching or discharge EARS: Denies change in hearing, ringing in ears, or earache NOSE: Denies nasal congestion or epistaxis MOUTH and THROAT: Denies oral discomfort, gingival pain or bleeding, pain from teeth or hoarseness   RESPIRATORY: no cough, SOB, DOE, wheezing, hemoptysis CARDIAC: no chest pain, edema or palpitations GI: no abdominal pain, diarrhea, heart burn, nausea or vomiting, + constipation GU: Complains of frequency  PSYCHIATRIC: Denies feeling of depression or anxiety. No report of hallucinations, insomnia, paranoia, or agitation    PHYSICAL EXAMINATION  GENERAL APPEARANCE: Well nourished. In no acute distress. Morbidly obese SKIN:  Right knee surgical incision is healed, no erythema  HEAD: Normal in size and contour. No evidence of trauma EYES: Lids open and close normally. No blepharitis, entropion or ectropion. PERRL. Conjunctivae are clear and sclerae are white. Lenses are without opacity EARS: Pinnae are normal. Patient hears normal voice tunes of the examiner MOUTH and THROAT: Lips are without lesions. Oral mucosa is moist and without lesions. Tongue is normal in shape, size, and color and without lesions NECK: supple, trachea midline, no neck masses, no thyroid tenderness, no thyromegaly LYMPHATICS: no LAN in the neck, no supraclavicular LAN RESPIRATORY: breathing is even & unlabored, BS CTAB CARDIAC: RRR, no murmur,no extra heart sounds, no edema GI: abdomen soft, normal BS, no masses, no tenderness, no hepatomegaly, no splenomegaly EXTREMITIES:  Able to move 4 extremities PSYCHIATRIC: Alert and oriented X 3. Affect and behavior are appropriate   LABS/RADIOLOGY: Labs reviewed: Basic Metabolic Panel:  Recent Labs  16/10/96 1500  09/12/16 0459 09/13/16 0515 09/18/16    NA 142  --  141 143 144  K 3.1*  --  3.8 4.8 4.0  CL 109  --  106 109  --   CO2 25  --  24 26  --   GLUCOSE 87  --  135* 131*  --   BUN 15  --  18 22* 18  CREATININE 0.80  < > 0.83 0.78 0.8  CALCIUM 9.3  --  8.7* 9.0  --   < > = values in this interval not displayed. Liver Function Tests:  Recent Labs  09/01/16 1500  AST 15  ALT 13*  ALKPHOS 68  BILITOT 1.0  PROT 6.8  ALBUMIN 3.8   CBC:  Recent Labs  09/01/16 1500 09/11/16 1207 09/12/16 0459 09/13/16 0515 09/18/16  WBC 7.9 11.9* 10.0 10.9* 8.4  NEUTROABS 5.0  --   --   --  5  HGB 13.5 12.7 10.6* 10.5* 11.7*  HCT 40.2 37.8 32.1* 31.8* 35*  MCV 86.6 87.3 86.5 88.6  --   PLT 295 236 233 240 329    Dg Chest 2 View  Result Date: 09/01/2016 CLINICAL DATA:  Upper respiratory infection EXAM: CHEST  2 VIEW COMPARISON:  01/07/2016 chest radiograph. FINDINGS: Stable cardiomediastinal silhouette with normal heart size. No pneumothorax. No pleural effusion. New mild patchy opacity at the left lung base. IMPRESSION: New mild patchy opacity at the left lung base, which may represent a pneumonia. Recommend follow-up PA and lateral post treatment chest radiographs in 4-6 weeks. Electronically Signed   By: Delbert Phenix M.D.   On: 09/01/2016 16:40   Ct Chest W Contrast  Result Date: 09/08/2016 CLINICAL DATA:  Follow-up abnormal chest x-ray EXAM: CT CHEST WITH CONTRAST TECHNIQUE: Multidetector CT imaging of the chest was performed during intravenous contrast administration. CONTRAST:  ISOVUE-300 IOPAMIDOL (ISOVUE-300) INJECTION 61% COMPARISON:  09/01/2016 FINDINGS: Cardiovascular: Thoracic aorta demonstrates atherosclerotic calcifications without aneurysmal dilatation or dissection. Mild coronary calcifications are seen. No cardiac enlargement is noted. The pulmonary artery as visualized is within normal limits. Mediastinum/Nodes: No significant hilar or mediastinal adenopathy is noted. Thoracic inlet is within normal limits. Mild  tortuosity of the right common carotid artery is noted. Lungs/Pleura: The lungs are well aerated bilaterally. The previously seen opacity at the left lung base has resolved in the interval and likely represented mild infiltrate or atelectatic changes. No sizable effusion or pneumothorax is noted. No focal parenchymal nodule is seen. Upper Abdomen: Within normal limits. Musculoskeletal: Mild degenerative changes of the thoracic spine are seen. IMPRESSION: Resolution of previously seen basilar opacity on the left. No new focal abnormality is seen. Electronically Signed   By: Alcide Clever M.D.   On: 09/08/2016 07:45   Mm Screening Breast Tomo Bilateral  Result Date: 09/01/2016 CLINICAL DATA:  Screening. EXAM: 2D DIGITAL SCREENING BILATERAL MAMMOGRAM WITH CAD AND ADJUNCT TOMO COMPARISON:  Previous exam(s). ACR Breast Density Category b: There are scattered areas of fibroglandular density. FINDINGS: There are no findings suspicious for malignancy. Images were processed with CAD. IMPRESSION: No mammographic evidence of malignancy. A result letter of this screening mammogram will be mailed directly to the patient. RECOMMENDATION: Screening mammogram in one year. (Code:SM-B-01Y) BI-RADS CATEGORY  1: Negative. Electronically Signed   By: Amie Portland M.D.   On: 09/01/2016 13:35    ASSESSMENT/PLAN:  Unsteady gait - will have outpatient rehabilitation for therapeutic strengthening exercises; fall precautions  Posttraumatic osteoarthritis of right knee - S/P right total knee arthroplasty on 09/11/16, follow-up with orthopedics, Dr. Salvatore Marvel;  oxycodone 5 mg 1-2 tabs by mouth every 6 hours when necessary for pain; recently started on Robaxin 500 mg 1 tab PO Q 8 hours PRN for muscle spasm; was seen by physiatry   Hypertension - well-controlled; continue losartan-HCTZ 100-25 mg 1 tab by mouth daily, Toprol-XL 25 mg 1 tab by mouth daily and Norvasc 5 mg 1 tab by mouth daily  Slow transit constipation - continue  senna S 2 tabs by mouth twice a day, MiraLAX 17 g by mouth twice a day and Dulcolax 10 mg suppository 1 rectally daily when necessary  Anemia, acute blood loss - stable Lab Results  Component Value Date   HGB 11.7 (A) 09/18/2016  GERD - continue Omeprazole 40 mg 1 capsule PO BID  Major depression - mood is stable; continue citalopram 20 mg 1 tab by mouth daily  Obstructive sleep apnea - continue CPAP at at bedtime  Overactive bladder - continue Tolterodine  ER 4 mg 1 by mouth daily  UTI - continue Cipro 500 mg 1 tab PO BID for a total of 7 days and Florastor 250 mg 1 capsule PO BID X 10 days     I have filled out patient's discharge paperwork and written prescriptions.  Patient will have outpatient rehabilitation.  DME provided:  None  Total discharge time: Less than 30 minutes  Discharge time involved coordination of the discharge process with social worker, nursing staff and therapy department.      Rekita Miotke C. Medina-Vargas - NP    [employer] [phone]

## 2016-12-01 NOTE — Addendum Note (Signed)
Addendum  created 12/01/16 1108 by Leilani AbleHatchett, Kellyn Mansfield, MD   Sign clinical note

## 2017-03-01 NOTE — Progress Notes (Signed)
76 y.o. G27P3003 Divorced  Caucasian Fe here for annual exam. Menopausal with no vaginal dryness, not sexually active. Denies vaginal bleeding also. Still having occasional urinary leakage and wears pads daily. Has noted some increase in leaking in the past week. No dysuria. Leaving for trip to [place] in two days.. Struggling with weight and doing water aerobics. Seeing orthopedic for knee pain. Sees PCP for Hypertension/labs,depression medication management, with visits every 3 months per patient. Still driving with no issues. Some vagina irritation due to pads, otherwise no change. No other health concerns today. Spent time with family this summer!  Patient's last menstrual period was 06/26/1996.          Sexually active: No.  The current method of family planning is tubal ligation and post menopausal status.    Exercising: Yes.    water aerobics Smoker:  no  Health Maintenance: Pap:  03/02/16 Neg   12/03/09 neg  History of Abnormal Pap: no MMG:  09/01/16 BIRADS1:neg  Self Breast exams: no Colonoscopy:  2017? Neg  BMD:   2016 TDaP:  2014 Shingles: Done Pneumonia: Done Hep C and HIV: Unsure Labs: Not today   reports that she has never smoked. She has never used smokeless tobacco. She reports that she does not drink alcohol or use drugs.  Past Medical History:  Diagnosis Date  . Abnormal uterine bleeding (AUB)   . Acid reflux   . Depression   . HTN (hypertension)   . Left knee DJD   . Neuropathy   . Obesity   . Post-traumatic osteoarthritis of right knee   . S/P total knee replacement, left   . Sleep apnea    uses CPAP  . SUI (stress urinary incontinence, female)     Past Surgical History:  Procedure Laterality Date  . ANKLE FRACTURE SURGERY Left 04   fusion  . APPENDECTOMY    . BACK SURGERY    . BLADDER SURGERY    . BREAST EXCISIONAL BIOPSY Left 1987  . EYE SURGERY Bilateral 14   catarcts  . KNEE ARTHROSCOPY Left 12/2011  . LUMBAR DISC SURGERY  12/94  . TOTAL KNEE  ARTHROPLASTY Left 06/09/2013   DR Thurston Hole  . TOTAL KNEE ARTHROPLASTY Left 06/09/2013   Procedure: TOTAL KNEE ARTHROPLASTY;  Surgeon: Nilda Simmer, MD;  Location: [facility] OR;  Service: Orthopedics;  Laterality: Left;  . TOTAL KNEE ARTHROPLASTY Right 09/11/2016   Procedure: TOTAL KNEE ARTHROPLASTY;  Surgeon: Salvatore Marvel, MD;  Location: [facility] OR;  Service: Orthopedics;  Laterality: Right;  . TUBAL LIGATION Bilateral     Current Outpatient Prescriptions  Medication Sig Dispense Refill  . amLODipine (NORVASC) 5 MG tablet Take 5 mg by mouth daily.    . Aspirin (ASPIR-81 PO) Take by mouth daily.    . calcium carbonate 1250 MG capsule Take 1,250 mg by mouth 2 (two) times daily with a meal.     . cholecalciferol (VITAMIN D) 1000 units tablet Take 1,000 Units by mouth daily.    . citalopram (CELEXA) 20 MG tablet Take 20 mg by mouth daily.     . Cranberry 200 MG CAPS Take by mouth.    . losartan-hydrochlorothiazide (HYZAAR) 100-25 MG tablet Take 1 tablet by mouth daily.    . metoprolol succinate (TOPROL-XL) 25 MG 24 hr tablet 25 mg daily.     . montelukast (SINGULAIR) 10 MG tablet Take 10 mg by mouth at bedtime.     . Multiple Vitamin (MULTIVITAMIN) tablet Take 1 tablet by mouth  daily.    . NON FORMULARY CPAP to applied during the hours of sleep    . omeprazole (PRILOSEC) 40 MG capsule Take 40 mg by mouth 2 (two) times daily.     Marland Kitchen. tolterodine (DETROL LA) 4 MG 24 hr capsule Take 4 mg by mouth daily.    . vitamin C (ASCORBIC ACID) 250 MG tablet Take 250 mg by mouth daily.     No current facility-administered medications for this visit.     Family History  Problem Relation Age of Onset  . Heart disease Mother   . Heart attack Mother   . Stroke Mother   . Heart disease Father   . Heart attack Father   . Hypertension Unknown   . Clotting disorder Sister        sister died of a PE post op    ROS:  Pertinent items are noted in HPI.  Otherwise, a comprehensive ROS was negative.  Exam:   BP  140/80 (BP Location: Right Arm, Patient Position: Sitting, Cuff Size: Large)   Pulse 76   Resp 16   Ht 5\' 3"  (1.6 m)   Wt 240 lb (108.9 kg)   LMP 06/26/1996   BMI 42.51 kg/m  Height: 5\' 3"  (160 cm) Ht Readings from Last 3 Encounters:  03/02/17 5\' 3"  (1.6 m)  09/27/16 5\' 3"  (1.6 m)  09/15/16 5' 3.5" (1.613 m)    General appearance: alert, cooperative and appears stated age Head: Normocephalic, without obvious abnormality, atraumatic Neck: no adenopathy, supple, symmetrical, trachea midline and thyroid normal to inspection and palpation Lungs: clear to auscultation bilaterally CVAT: negative bilateral Breasts: normal appearance, no masses or tenderness, No nipple retraction or dimpling, No nipple discharge or bleeding, No axillary or supraclavicular adenopathy Heart: regular rate and rhythm Abdomen: soft, non-tender; no masses,  no organomegaly, negative suprapubic Extremities: extremities normal, atraumatic, no cyanosis or edema Skin: Skin color, texture, turgor normal. No rashes or lesions Lymph nodes: Cervical, supraclavicular, and axillary nodes normal. No abnormal inguinal nodes palpated Neurologic: Grossly normal   Pelvic: External genitalia:  no lesions, normal female, some increase pink in groin area from underwear, no excoriation noted              Urethra:  normal appearing urethra with no masses, tenderness or lesions  Bladder: non tender, urethral meatus non tender, no redness              Bartholin's and Skene's: normal                 Vagina: normal appearing vagina with normal color and discharge, no lesions, cystocele grade 2 no change              Cervix: no cervical motion tenderness and no lesions              Pap taken: No. Bimanual Exam:  Uterus:  normal size, contour, position, consistency, mobility, non-tender              Adnexa: normal adnexa and no mass, fullness, tenderness               Rectovaginal: Confirms               Anus:  normal sphincter tone,  no lesions  Chaperone present: yes  A:  Well Woman with normal exam  Menopausal no HRT  Atrophic vaginitis uses coconut oil for dryness and external skin protection  Cystocele no change  R/O UTI asymptomatic +  nitrites  Hypertension/urge incontinence/.depression management with PCP  P:   Reviewed health and wellness pertinent to exam  Aware of need to evaluate if vaginal bleeding  Discussed continuing to use coconut oil areas of dryness and groin area to protect. If redness or exudate occurs needs to come in.  Discussed urine findings, but not symptomatic. Will send urine culture and micro. Will give Rx Bactrim for patient to take with her on trip and will start if needed or if positive culture. Increase water intake. Warning signs of UTI given and need to advise. Discussed possible Depends use instead of pads which make work better.  Rx Bactrim see order with instructions.  Continue with PCP as indicated  Pap smear: no  counseled on breast self exam, mammography screening, adequate intake of calcium and vitamin D, diet and exercise, Kegel's exercises return annually or prn  An After Visit Summary was printed and given to the patient.

## 2017-03-02 ENCOUNTER — Encounter: Payer: Self-pay | Admitting: Certified Nurse Midwife

## 2017-03-02 ENCOUNTER — Ambulatory Visit (INDEPENDENT_AMBULATORY_CARE_PROVIDER_SITE_OTHER): Payer: Medicare PPO | Admitting: Certified Nurse Midwife

## 2017-03-02 VITALS — BP 140/80 | HR 76 | Resp 16 | Ht 63.0 in | Wt 240.0 lb

## 2017-03-02 DIAGNOSIS — Z0000 Encounter for general adult medical examination without abnormal findings: Secondary | ICD-10-CM

## 2017-03-02 DIAGNOSIS — R319 Hematuria, unspecified: Secondary | ICD-10-CM

## 2017-03-02 DIAGNOSIS — N952 Postmenopausal atrophic vaginitis: Secondary | ICD-10-CM | POA: Diagnosis not present

## 2017-03-02 DIAGNOSIS — Z01419 Encounter for gynecological examination (general) (routine) without abnormal findings: Secondary | ICD-10-CM | POA: Diagnosis not present

## 2017-03-02 DIAGNOSIS — N390 Urinary tract infection, site not specified: Secondary | ICD-10-CM

## 2017-03-02 LAB — POCT URINALYSIS DIPSTICK
BILIRUBIN UA: NEGATIVE
Glucose, UA: NEGATIVE
KETONES UA: NEGATIVE
Nitrite, UA: POSITIVE
PH UA: 5 (ref 5.0–8.0)
PROTEIN UA: NEGATIVE
Urobilinogen, UA: 0.2 E.U./dL

## 2017-03-02 MED ORDER — SULFAMETHOXAZOLE-TRIMETHOPRIM 800-160 MG PO TABS: 1.0000 | ORAL_TABLET | Freq: Two times a day (BID) | ORAL | 0 refills | Status: DC

## 2017-03-02 NOTE — Patient Instructions (Signed)

## 2017-03-03 LAB — URINALYSIS, MICROSCOPIC ONLY
Casts: NONE SEEN /lpf
WBC, UA: >30 /hpf — AB (ref 0–?)

## 2017-03-06 LAB — URINE CULTURE

## 2017-08-07 ENCOUNTER — Other Ambulatory Visit: Payer: Self-pay | Admitting: Internal Medicine

## 2017-08-07 DIAGNOSIS — Z1231 Encounter for screening mammogram for malignant neoplasm of breast: Secondary | ICD-10-CM

## 2017-09-04 ENCOUNTER — Ambulatory Visit
Admission: RE | Admit: 2017-09-04 | Discharge: 2017-09-04 | Disposition: A | Discharge status: Home / Self Care | Payer: Medicare PPO | Source: Ambulatory Visit | Attending: Internal Medicine | Admitting: Internal Medicine

## 2017-09-04 DIAGNOSIS — Z1231 Encounter for screening mammogram for malignant neoplasm of breast: Secondary | ICD-10-CM

## 2018-01-09 ENCOUNTER — Telehealth: Payer: Self-pay | Admitting: Certified Nurse Midwife

## 2018-01-09 NOTE — Telephone Encounter (Signed)
Left patient a message to call back to reschedule a future appointment that was cancelled by the provider for an AEX. °

## 2018-03-06 ENCOUNTER — Ambulatory Visit: Payer: Medicare PPO | Admitting: Certified Nurse Midwife

## 2018-03-14 ENCOUNTER — Encounter: Payer: Self-pay | Admitting: Certified Nurse Midwife

## 2018-03-14 ENCOUNTER — Other Ambulatory Visit: Payer: Self-pay

## 2018-03-14 ENCOUNTER — Ambulatory Visit (INDEPENDENT_AMBULATORY_CARE_PROVIDER_SITE_OTHER): Payer: Medicare PPO | Admitting: Certified Nurse Midwife

## 2018-03-14 VITALS — BP 120/80 | HR 70 | Resp 16 | Ht 63.0 in | Wt 235.0 lb

## 2018-03-14 DIAGNOSIS — R8290 Unspecified abnormal findings in urine: Secondary | ICD-10-CM

## 2018-03-14 DIAGNOSIS — Z0000 Encounter for general adult medical examination without abnormal findings: Secondary | ICD-10-CM | POA: Diagnosis not present

## 2018-03-14 DIAGNOSIS — Z01419 Encounter for gynecological examination (general) (routine) without abnormal findings: Secondary | ICD-10-CM | POA: Diagnosis not present

## 2018-03-14 DIAGNOSIS — N3941 Urge incontinence: Secondary | ICD-10-CM

## 2018-03-14 DIAGNOSIS — Z01411 Encounter for gynecological examination (general) (routine) with abnormal findings: Secondary | ICD-10-CM

## 2018-03-14 DIAGNOSIS — N814 Uterovaginal prolapse, unspecified: Secondary | ICD-10-CM

## 2018-03-14 NOTE — Patient Instructions (Signed)

## 2018-03-14 NOTE — Progress Notes (Signed)
77 y.o. [id number] Divorced  Caucasian Fe here for annual exam. Working on weight loss with [employer]ystem. Still exercising with water aerobics! Sees PCP Dr. Brynda RimGallemore for aex, labs and hypertension, cholesterol, asthma, GERD,depression medication. All stable. Detrol working urgency, but not incontinence. Previous bladder surgery repair, but now wearing Depends all the time. No UTI symptoms today. Planning to take Shingrex, once supply is in. Denies Vaginal dryness or vaginal bleeding. Using coconut oil for vaginal dryness and skin protection. No other health issues today.  Patient's last menstrual period was 06/26/1996.          Sexually active: No.  The current method of family planning is tubal ligation.    Exercising: Yes.    water aerobics Smoker:  no  Review of Systems  Constitutional: Negative.   HENT: Negative.   Eyes: Negative.   Respiratory: Negative.   Cardiovascular: Negative.   Genitourinary: Positive for frequency and urgency.       Night urination, loss of urine with cough or sneeze, vaginal itchint  Musculoskeletal:       Muscle/joint pain  Skin: Positive for itching.       New mole change  Neurological: Negative.   Endo/Heme/Allergies:       Craving sweets  Psychiatric/Behavioral: Negative.     Health Maintenance: Pap:  03-02-16 neg History of Abnormal Pap: no MMG:  09-04-17 category b density birads 1:neg Self Breast exams: no Colonoscopy:  2017 negative 10 year return BMD:   2016 normal TDaP:  2014 Shingles: had 1st one done HIV, Hep c: not done Labs:   reports that she has never smoked. She has never used smokeless tobacco. She reports that she does not drink alcohol or use drugs.  Past Medical History:  Diagnosis Date  . Abnormal uterine bleeding (AUB)   . Acid reflux   . Depression   . HTN (hypertension)   . Left knee DJD   . Neuropathy   . Obesity   . Post-traumatic osteoarthritis of right knee   . S/P total knee replacement, left   . Sleep apnea     uses CPAP  . SUI (stress urinary incontinence, female)     Past Surgical History:  Procedure Laterality Date  . ANKLE FRACTURE SURGERY Left 04   fusion  . APPENDECTOMY    . BACK SURGERY    . BLADDER SURGERY    . BREAST EXCISIONAL BIOPSY Left 1987  . EYE SURGERY Bilateral 14   catarcts  . KNEE ARTHROSCOPY Left 12/2011  . LUMBAR DISC SURGERY  12/94  . TOTAL KNEE ARTHROPLASTY Left 06/09/2013   DR Thurston HoleWAINER  . TOTAL KNEE ARTHROPLASTY Left 06/09/2013   Procedure: TOTAL KNEE ARTHROPLASTY;  Surgeon: Nilda Simmerobert A Wainer, MD;  Location: [facility] OR;  Service: Orthopedics;  Laterality: Left;  . TOTAL KNEE ARTHROPLASTY Right 09/11/2016   Procedure: TOTAL KNEE ARTHROPLASTY;  Surgeon: Salvatore Marvelobert Wainer, MD;  [facility];  Service: Orthopedics;  Laterality: Right;  . TUBAL LIGATION Bilateral     Current Outpatient Medications  Medication Sig Dispense Refill  . amLODipine (NORVASC) 5 MG tablet Take 5 mg by mouth daily.    . Aspirin (ASPIR-81 PO) Take by mouth daily.    . calcium carbonate 1250 MG capsule Take 1,250 mg by mouth 2 (two) times daily with a meal.     . cholecalciferol (VITAMIN D) 1000 units tablet Take 1,000 Units by mouth daily.    . citalopram (CELEXA) 20 MG tablet Take 20 mg by mouth daily.     .Marland Kitchen  Cranberry 200 MG CAPS Take by mouth.    . losartan-hydrochlorothiazide (HYZAAR) 100-25 MG tablet Take 1 tablet by mouth daily.    . metoprolol succinate (TOPROL-XL) 25 MG 24 hr tablet 25 mg daily.     . montelukast (SINGULAIR) 10 MG tablet Take 10 mg by mouth at bedtime.     . Multiple Vitamin (MULTIVITAMIN) tablet Take 1 tablet by mouth daily.    . NON FORMULARY CPAP to applied during the hours of sleep    . omeprazole (PRILOSEC) 40 MG capsule Take 40 mg by mouth 2 (two) times daily.     Marland Kitchen sulfamethoxazole-trimethoprim (BACTRIM DS) 800-160 MG tablet Take 1 tablet by mouth 2 (two) times daily. One PO BID x 3 days 6 tablet 0  . tolterodine (DETROL LA) 4 MG 24 hr capsule Take 4 mg by mouth daily.     . vitamin C (ASCORBIC ACID) 250 MG tablet Take 250 mg by mouth daily.     No current facility-administered medications for this visit.     Family History  Problem Relation Age of Onset  . Heart disease Mother   . Heart attack Mother   . Stroke Mother   . Heart disease Father   . Heart attack Father   . Hypertension Unknown   . Clotting disorder Sister        sister died of a PE post op    ROS:  Pertinent items are noted in HPI.  Otherwise, a comprehensive ROS was negative.  Exam:   LMP 06/26/1996    Ht Readings from Last 3 Encounters:  03/02/17 5\' 3"  (1.6 m)  09/27/16 5\' 3"  (1.6 m)  09/15/16 5' 3.5" (1.613 m)    General appearance: alert, cooperative and appears stated age Head: Normocephalic, without obvious abnormality, atraumatic Neck: no adenopathy, supple, symmetrical, trachea midline and thyroid normal to inspection and palpation Lungs: clear to auscultation bilaterally Breasts: normal appearance, no masses or tenderness, No nipple retraction or dimpling, No nipple discharge or bleeding, No axillary or supraclavicular adenopathy Heart: regular rate and rhythm Abdomen: soft, non-tender; no masses,  no organomegaly Extremities: extremities normal, atraumatic, no cyanosis or edema Skin: Skin color, texture, turgor normal. No rashes or lesions Lymph nodes: Cervical, supraclavicular, and axillary nodes normal. No abnormal inguinal nodes palpated Neurologic: Grossly normal   Pelvic: External genitalia:  no lesions, atrophic appearance, cystocele grade 1-2              Urethra:  normal appearing urethra with no masses, tenderness or lesions              Bartholin's and Skene's: normal                 Vagina: normal appearing vagina with normal color and discharge, no lesions              Cervix: multiparous appearance, no cervical motion tenderness and no lesions              Pap taken: No. Bimanual Exam:  Uterus:  normal size, contour, position, consistency,  mobility, non-tender and anteverted              Adnexa: normal adnexa and no mass, fullness, tenderness               Rectovaginal: Confirms               Anus:  normal sphincter tone, no lesions  Chaperone present: yes  A:  Well Woman with normal  exam  Menopausal    Vaginal dryness using coconut oil with good results  Cystocele symptomatic with incontinence  Hypertension, cholesterol, depression management with PCP    P:   Reviewed health and wellness pertinent to exam  Discussed continuing coconut oil to help with dryness  Discussed cystocele finding and option of pessary to see if it would help. Patient would like to try. Patient will schedule pessary fitting.  Lab: Urine culture  Continue follow up with PCP as indicated  Pap smear: no   counseled on breast self exam, mammography screening, feminine hygiene, adequate intake of calcium and vitamin D, diet and exercise  return annually or prn  An After Visit Summary was printed and given to the patient.

## 2018-03-16 LAB — URINE CULTURE

## 2018-03-17 ENCOUNTER — Other Ambulatory Visit: Payer: Self-pay | Admitting: Certified Nurse Midwife

## 2018-03-18 ENCOUNTER — Other Ambulatory Visit: Payer: Self-pay | Admitting: Certified Nurse Midwife

## 2018-03-18 DIAGNOSIS — N390 Urinary tract infection, site not specified: Secondary | ICD-10-CM

## 2018-03-18 MED ORDER — SULFAMETHOXAZOLE-TRIMETHOPRIM 800-160 MG PO TABS: ORAL_TABLET | ORAL | 0 refills | Status: DC

## 2018-03-19 ENCOUNTER — Ambulatory Visit: Payer: Medicare PPO | Admitting: Certified Nurse Midwife

## 2018-04-03 ENCOUNTER — Ambulatory Visit (INDEPENDENT_AMBULATORY_CARE_PROVIDER_SITE_OTHER): Payer: Medicare PPO | Admitting: Certified Nurse Midwife

## 2018-04-03 ENCOUNTER — Encounter: Payer: Self-pay | Admitting: Certified Nurse Midwife

## 2018-04-03 ENCOUNTER — Other Ambulatory Visit: Payer: Self-pay

## 2018-04-03 VITALS — BP 120/74 | HR 70 | Resp 16 | Wt 234.0 lb

## 2018-04-03 DIAGNOSIS — R8290 Unspecified abnormal findings in urine: Secondary | ICD-10-CM

## 2018-04-03 DIAGNOSIS — R32 Unspecified urinary incontinence: Secondary | ICD-10-CM | POA: Diagnosis not present

## 2018-04-03 DIAGNOSIS — N8111 Cystocele, midline: Secondary | ICD-10-CM | POA: Diagnosis not present

## 2018-04-03 DIAGNOSIS — N904 Leukoplakia of vulva: Secondary | ICD-10-CM

## 2018-04-03 DIAGNOSIS — Z87440 Personal history of urinary (tract) infections: Secondary | ICD-10-CM

## 2018-04-03 MED ORDER — CLOBETASOL PROPIONATE 0.05 % EX OINT: TOPICAL_OINTMENT | CUTANEOUS | 0 refills | Status: DC

## 2018-04-03 NOTE — Progress Notes (Signed)
77 y.o. Divorced White female   [phone] here for pessary fitting.  Patient has been diagnosed with the following indications warranting pessary use: Cystocele grade 1-2 symptomatic and urinary incontinence when awake. . She has had previous bladder repair. Patient takes Detrol which helps with urgency, but not incontinence in daytime. No leaking at night or urgency. Recent treatment for UTI, has completed medication. Culture to be collected today to make sure resolved. Patient is aware she may not be a candidate for pessary use with previous repair. She would like to try to use pessary if possible. Uses Coconut oil for vaginal dryness with good results. No other health issues today.    She reports these associated symptoms: Vaginal discharge: no Vaginal pressure:  yes Urinary symptoms:   urinary incontinence sporadic during ambulation in daytime.   She has been counseled about other options including pelvic physical therapy, surgical intervention, as well as doing nothing.  She has decided to proceed with pessary use if possible and is here for fitting today.    Patient is not sexually active. Urine culture has been performed.  Results:  E.Coli treated prior to fitting today.  Exam: BP 120/74   Pulse 70   Resp 16   Wt 234 lb (106.1 kg)   LMP 06/26/1996   BMI 41.45 kg/m  General appearance: alert, cooperative and appears stated age inguinal lymph nodes not enlarged or tender   Pelvic: External genitalia:  no lesions and atrophic appearance, Lichen sclerosis flare noted at clitoral area and on upper vulva area.               Urethra:  normal appearing urethra with no masses, tenderness or lesions, leaking noted with change of position              Bartholins and Skenes: normal                 Vagina: normal appearing vagina with normal color and discharge, no lesions, atrophy noted, using coconut oil for dryness, Cystocele grade 1-2              Cervix: normal appearance Bimanual Exam:   Uterus:  uterus is normal size, shape, consistency and nontender                               Adnexa:    normal adnexa in size, nontender and no masses                               Anus:  normal sphincter tone, no lesions  Procedure:  Patient fitted with the following pessary and sizes:  Ring with knob and support size 3, 4 and ring with support size 3,4 .  After each fitting, patient was advised to redress, ambulate, and attempt to void. Best fit was #4 ring with knob and support, but with upright position patient leaked urine with cough or slight pelvic pressure." I think it is my weight". Pessary removed without difficulty. Patient has seen Urology with no help with this issue, frustrated.  Assessment:   Normal exam Cystocele with spontaneous urinary leakage, daytime only, uses depends daily Not a pessary candidate Recent UTI treatment for E.Coli Lichen Sclerosis flare not being treated Previous bladder repair ? Year Vaginal dryness using coconut oil as needed  Plan: Discussed with patient pessary not able to support her problem with urinary  incontinence, and so not a candidate for use. Patient questions addressed. Lab: Urine culture Discussed daily cranberry tablet to help with UTI prevention and sometimes decrease occurrence. Discussed Lichen Sclerosis finding and need to restart treatment, could be increasing her risk of UTI . Rx Clobetasol Ointment bid x 4 weeks thinly to area and then once daily. Need recheck in 4 weeks. Discussed possible consult with Dr. Edward Jolly if she feels there is something else we can offer her. Patient appreciative and will be called with recommendation. She may need to return to Urology for further evaluation, she does not feel this has been helpful and doubtful she would go again. Questions addressed.  Rv prn as above 4 weeks   Aftercare summary was provided.

## 2018-04-04 ENCOUNTER — Encounter: Payer: Self-pay | Admitting: Certified Nurse Midwife

## 2018-04-04 LAB — URINE CULTURE: ORGANISM ID, BACTERIA: NO GROWTH

## 2018-05-03 ENCOUNTER — Other Ambulatory Visit: Payer: Self-pay

## 2018-05-03 ENCOUNTER — Ambulatory Visit (INDEPENDENT_AMBULATORY_CARE_PROVIDER_SITE_OTHER): Payer: Medicare PPO | Admitting: Certified Nurse Midwife

## 2018-05-03 ENCOUNTER — Encounter: Payer: Self-pay | Admitting: Certified Nurse Midwife

## 2018-05-03 DIAGNOSIS — N904 Leukoplakia of vulva: Secondary | ICD-10-CM

## 2018-05-03 MED ORDER — CLOBETASOL PROPIONATE 0.05 % EX OINT: TOPICAL_OINTMENT | CUTANEOUS | 1 refills | Status: DC

## 2018-05-03 NOTE — Patient Instructions (Signed)
Apply medication twice daily for 2 weeks and then once daily x 1 week

## 2018-05-03 NOTE — Progress Notes (Signed)
Review of Systems  Constitutional: Negative.   HENT: Negative.   Eyes: Negative.   Respiratory: Negative.   Cardiovascular: Negative.   Gastrointestinal: Negative.   Genitourinary: Negative for frequency and urgency.       Urinary frequency and nocturia has stopped   Musculoskeletal: Negative.   Skin: Negative.  Negative for itching.       Itching in vaginal area has stopped with Clobetasol use  Neurological: Negative.   Endo/Heme/Allergies: Negative.   Psychiatric/Behavioral: Negative.     Physical Exam  Constitutional: She is oriented to person, place, and time. She appears well-developed and well-nourished.  Abdominal: Soft.  Genitourinary:    Pelvic exam was performed with patient supine. There is no rash, tenderness, lesion or injury on the right labia. There is no rash, tenderness, lesion or injury on the left labia.  Lymphadenopathy: No inguinal adenopathy noted on the right or left side.  Neurological: She is alert and oriented to person, place, and time.  Skin: Skin is warm and dry.  Psychiatric: She has a normal mood and affect. Her speech is normal and behavior is normal. Judgment and thought content normal. Cognition and memory are normal.    A: History of chronic vulva/vaginal/perineal itching, appearance previous consistent with Lichen Sclerosis with improvement in appearance. All Itching has resolved. Responding well to Clobetasol Urinary frequency improved Menopausal  P: Discussed finding with patient and improved appearance shown to patient with mirror. Continue with Clobetasol ointment bid x 2 weeks to affected area and then once daily for one week and recheck. Questions addressed.  Rv 3 weeks

## 2018-05-29 ENCOUNTER — Ambulatory Visit (INDEPENDENT_AMBULATORY_CARE_PROVIDER_SITE_OTHER): Payer: Medicare PPO | Admitting: Certified Nurse Midwife

## 2018-05-29 ENCOUNTER — Other Ambulatory Visit: Payer: Self-pay

## 2018-05-29 ENCOUNTER — Encounter: Payer: Self-pay | Admitting: Certified Nurse Midwife

## 2018-05-29 VITALS — BP 118/68 | HR 68 | Resp 16 | Wt 237.0 lb

## 2018-05-29 DIAGNOSIS — L900 Lichen sclerosus et atrophicus: Secondary | ICD-10-CM

## 2018-05-29 DIAGNOSIS — N3946 Mixed incontinence: Secondary | ICD-10-CM

## 2018-05-29 DIAGNOSIS — Z9889 Other specified postprocedural states: Secondary | ICD-10-CM | POA: Diagnosis not present

## 2018-05-29 NOTE — Patient Instructions (Signed)
Apply to area twice daily for 2 weeks and then once daily for 2 weeks.

## 2018-05-29 NOTE — Progress Notes (Signed)
  Subjective:     Patient ID: Jennifer Dyer, female   DOB: 10/01/1940, 77 y.o.   MRN: [id number]  77 yo white female here for follow up of Lichen Sclerosis noted in vulva area at last appointment. Patient treating area with clobetasol bid x 1 month . "Feels so much better, the burning, itching has stopped". Denies any vaginal bleeding or pelvic pain. Continues to have urinary incontinence. Would like to follow up with Dr. Ashley RoyaltyMatthews as recommended. No other health issues today.    Review of Systems  Constitutional: Negative.   HENT: Negative.   Eyes: Negative.   Respiratory: Negative.   Cardiovascular: Negative.   Gastrointestinal: Negative.   Endocrine: Negative.   Genitourinary: Negative for genital sores, hematuria, pelvic pain, urgency, vaginal bleeding, vaginal discharge and vaginal pain.       Continues with urinary leakage. Denies urinary frequency.  Musculoskeletal: Negative.   Skin: Negative.   Allergic/Immunologic: Negative.   Neurological: Negative.   Hematological: Negative.   Psychiatric/Behavioral: Negative.        Objective:   Physical Exam  Constitutional: She is oriented to person, place, and time. She appears well-developed and well-nourished.  Abdominal: Soft.  Genitourinary:    Pelvic exam was performed with patient supine. There is no rash, tenderness or lesion on the right labia. There is no rash, tenderness or lesion on the left labia.  Genitourinary Comments: Internal pelvic exam not done  Lymphadenopathy: No inguinal adenopathy noted on the right or left side.       Right: No inguinal adenopathy present.       Left: No inguinal adenopathy present.  Neurological: She is alert and oriented to person, place, and time.  Skin: Skin is warm and dry.  Psychiatric: She has a normal mood and affect. Her behavior is normal. Judgment and thought content normal.       Assessment:     Lichen Sclerosis of vulva area and around anal area, responding well to  Clobetasol use Chronic urinary mixed incontinence, previous history of repair    Plan:     Continue use of clobetasol on areas shown patient in mirror twice daily for 2 weeks and then once daily for two weeks, recheck, if resolved treat as indicated with flare. Questions addressed. Discussed patient with Dr. Edward JollySilva and feel she may want to see Dr. Ashley RoyaltyMatthews for evaluation. Discussed with patient and will advise if desires to be referred.  Rv one month, prn

## 2018-07-02 ENCOUNTER — Ambulatory Visit (INDEPENDENT_AMBULATORY_CARE_PROVIDER_SITE_OTHER): Payer: Medicare HMO | Admitting: Certified Nurse Midwife

## 2018-07-02 ENCOUNTER — Encounter: Payer: Self-pay | Admitting: Certified Nurse Midwife

## 2018-07-02 VITALS — BP 116/70 | HR 68 | Resp 14

## 2018-07-02 DIAGNOSIS — L900 Lichen sclerosus et atrophicus: Secondary | ICD-10-CM

## 2018-07-02 DIAGNOSIS — N3942 Incontinence without sensory awareness: Secondary | ICD-10-CM | POA: Diagnosis not present

## 2018-07-02 DIAGNOSIS — N3946 Mixed incontinence: Secondary | ICD-10-CM

## 2018-07-02 NOTE — Patient Instructions (Signed)

## 2018-07-02 NOTE — Progress Notes (Signed)
  Subjective:     Patient ID: Jennifer FootsDiana M Dyer, female   DOB: 03/07/1941, 78 y.o.   MRN: [id number]  78 year old white female her for follow up for Lichen  Sclerosis treating with Clobetasol twice daily for 2 weeks and once daily for 2 weeks. Feels it is better. Denies itching and irritation now. Ready for referral to Urology regarding urinary leaking. No other health issues today.    Review of Systems  Constitutional: Negative.   HENT: Negative.   Eyes: Negative.   Respiratory: Negative.   Cardiovascular: Negative.   Endocrine: Negative.   Genitourinary: Positive for frequency and urgency.       Leaking urine with any movement  Allergic/Immunologic: Negative.   Neurological: Negative.   Hematological: Negative.        Objective:   Physical Exam Exam conducted with a chaperone present.  Constitutional:      Appearance: Normal appearance.  Cardiovascular:     Rate and Rhythm: Normal rate.  Pulmonary:     Effort: Pulmonary effort is normal.  Genitourinary:    Exam position: Supine.     Pubic Area: No rash.      Labia:        Right: No rash, tenderness or lesion.        Left: No rash, tenderness or lesion.      Urethra: No prolapse.     Vagina: No vaginal discharge, tenderness or lesions.    Lymphadenopathy:     Lower Body: No right inguinal adenopathy. No left inguinal adenopathy.  Skin:    General: Skin is warm and dry.  Neurological:     Mental Status: She is alert and oriented to person, place, and time.  Psychiatric:        Mood and Affect: Mood normal.        Behavior: Behavior normal.        Thought Content: Thought content normal.        Judgment: Judgment normal.        Assessment:     History of chronic Lichen Sclerosis resolved with Clobetasol Ointment use.  Mixed incontinence not a candidate for pessary support from previous fitting trial    Plan:     Discussed finding with patient. Instructed to stop Clobetasol use now. If itching returns as before  start Clobetasol twice daily and come in for exam. Patient agreeable. Discussed making sure no bacteria in urine due to leaking increase. Lab: Urine culture Patient to referred to [facility] for evaluation of mixed incontinence. Patient will be called with appointment information. Questions addressed.  Rv prn

## 2018-07-04 LAB — URINE CULTURE

## 2018-07-05 ENCOUNTER — Other Ambulatory Visit: Payer: Self-pay

## 2018-07-05 MED ORDER — CIPROFLOXACIN HCL 500 MG PO TABS: 500.0000 mg | ORAL_TABLET | Freq: Two times a day (BID) | ORAL | 0 refills | Status: DC

## 2018-07-05 NOTE — Telephone Encounter (Signed)
Left message for call back.

## 2018-07-05 NOTE — Telephone Encounter (Signed)
Patient notified of urinary tract infection culture result & cipro sent to the pharmacy. See lab.

## 2018-07-29 ENCOUNTER — Other Ambulatory Visit: Payer: Self-pay | Admitting: Internal Medicine

## 2018-07-29 DIAGNOSIS — Z1231 Encounter for screening mammogram for malignant neoplasm of breast: Secondary | ICD-10-CM

## 2018-07-30 DIAGNOSIS — L218 Other seborrheic dermatitis: Secondary | ICD-10-CM | POA: Diagnosis not present

## 2018-08-27 DIAGNOSIS — N3946 Mixed incontinence: Secondary | ICD-10-CM | POA: Diagnosis not present

## 2018-08-27 DIAGNOSIS — R350 Frequency of micturition: Secondary | ICD-10-CM | POA: Diagnosis not present

## 2018-09-06 ENCOUNTER — Ambulatory Visit
Admission: RE | Admit: 2018-09-06 | Discharge: 2018-09-06 | Disposition: A | Discharge status: Home / Self Care | Payer: Medicare HMO | Source: Ambulatory Visit | Attending: Internal Medicine | Admitting: Internal Medicine

## 2018-09-06 ENCOUNTER — Other Ambulatory Visit: Payer: Self-pay

## 2018-09-06 DIAGNOSIS — Z1231 Encounter for screening mammogram for malignant neoplasm of breast: Secondary | ICD-10-CM

## 2018-09-09 DIAGNOSIS — Z7722 Contact with and (suspected) exposure to environmental tobacco smoke (acute) (chronic): Secondary | ICD-10-CM | POA: Diagnosis not present

## 2018-09-09 DIAGNOSIS — N3281 Overactive bladder: Secondary | ICD-10-CM | POA: Diagnosis not present

## 2018-09-09 DIAGNOSIS — I10 Essential (primary) hypertension: Secondary | ICD-10-CM | POA: Diagnosis not present

## 2018-09-09 DIAGNOSIS — R32 Unspecified urinary incontinence: Secondary | ICD-10-CM | POA: Diagnosis not present

## 2018-09-09 DIAGNOSIS — R69 Illness, unspecified: Secondary | ICD-10-CM | POA: Diagnosis not present

## 2018-09-09 DIAGNOSIS — R682 Dry mouth, unspecified: Secondary | ICD-10-CM | POA: Diagnosis not present

## 2018-09-09 DIAGNOSIS — J309 Allergic rhinitis, unspecified: Secondary | ICD-10-CM | POA: Diagnosis not present

## 2018-09-09 DIAGNOSIS — Z6841 Body Mass Index (BMI) 40.0 and over, adult: Secondary | ICD-10-CM | POA: Diagnosis not present

## 2018-09-09 DIAGNOSIS — K219 Gastro-esophageal reflux disease without esophagitis: Secondary | ICD-10-CM | POA: Diagnosis not present

## 2018-11-26 DIAGNOSIS — L299 Pruritus, unspecified: Secondary | ICD-10-CM | POA: Diagnosis not present

## 2018-11-26 DIAGNOSIS — L218 Other seborrheic dermatitis: Secondary | ICD-10-CM | POA: Diagnosis not present

## 2018-12-26 ENCOUNTER — Telehealth: Payer: Self-pay | Admitting: Certified Nurse Midwife

## 2018-12-26 NOTE — Telephone Encounter (Signed)
Patient is calling regarding a possible bladder infection.

## 2018-12-26 NOTE — Telephone Encounter (Signed)
Spoke with patient. She states she may have UTI. Denies dysuria, frequency, hematuria or fever. Patient states has small stream with urinating and no other symptoms. States this is how last UTI started. Offered appointment today--patient declined. Advised we were closed tomorrow. She would like to wait and see Jennifer Dyer, CNM. Made appointment 12-31-18 at 10:00am. She knows to seek care sooner if symptoms worsen.

## 2018-12-31 ENCOUNTER — Ambulatory Visit (INDEPENDENT_AMBULATORY_CARE_PROVIDER_SITE_OTHER): Payer: Medicare HMO | Admitting: Certified Nurse Midwife

## 2018-12-31 ENCOUNTER — Other Ambulatory Visit: Payer: Self-pay

## 2018-12-31 ENCOUNTER — Encounter: Payer: Self-pay | Admitting: Certified Nurse Midwife

## 2018-12-31 VITALS — BP 114/70 | HR 68 | Temp 97.2°F | Resp 16 | Wt 236.0 lb

## 2018-12-31 DIAGNOSIS — R319 Hematuria, unspecified: Secondary | ICD-10-CM | POA: Diagnosis not present

## 2018-12-31 DIAGNOSIS — N390 Urinary tract infection, site not specified: Secondary | ICD-10-CM

## 2018-12-31 LAB — POCT URINALYSIS DIPSTICK
Bilirubin, UA: NEGATIVE
Glucose, UA: NEGATIVE
Ketones, UA: NEGATIVE
Nitrite, UA: POSITIVE
Protein, UA: POSITIVE — AB
Urobilinogen, UA: NEGATIVE E.U./dL — AB
pH, UA: 5 (ref 5.0–8.0)

## 2018-12-31 MED ORDER — CIPROFLOXACIN HCL 500 MG PO TABS: 500.0000 mg | ORAL_TABLET | Freq: Two times a day (BID) | ORAL | 0 refills | Status: DC

## 2018-12-31 NOTE — Progress Notes (Signed)
78 y.o. Divorced Caucasian female [phone] here with complaint of urinary frequency and possible UTI, with onset 4 days ago. Patient was able to void when she arrived here.. Patient complaining of urinary tingling and burning on onset. No  frequency/urgency/ and,slight pain with urination. Continues with some incontinence and still wearing pads daily. Patient denies fever, chills, nausea or back pain. No new personal products..Vaginal itching, no increase in discharge.  Menopausal with vaginal dryness. No recent Lichen Sclerosis flares. Patient usually drinks  adequate water intake and has been working on weight loss also. Seeing Urology for incontinence treatment now. No other issues today.  Review of Systems  Constitutional: Negative.   HENT: Negative.   Eyes: Negative.   Respiratory: Negative.   Cardiovascular: Negative.   Gastrointestinal: Negative.   Genitourinary:       Small urine flow  Musculoskeletal: Negative.   Skin: Negative.   Neurological: Negative.   Endo/Heme/Allergies: Negative.   Psychiatric/Behavioral: Negative.     O: Healthy female WDWN Affect: Normal, orientation x 3 Skin : warm and dry CVAT: negative bilateral Abdomen: positive for suprapubic tenderness  Pelvic exam: External genital area: normal, no lesions, no Lichen Sclerosus flare noted Bladder,Urethra tender, Urethral meatus: tender, red Vagina: normal vaginal discharge, atrophic appearance   Cervix: normal, non tender Uterus:normal,non tender Adnexa: normal non tender, no fullness or masses  poct urine-rbc tr, protein+, nitrite+, wbc 2+  A: UTI Normal pelvic exam  P: Reviewed findings of UTI and need for treatment. Rx: Cipro 500 mg bid x 3 [id number]:[id number] micro, culture Reviewed warning signs and symptoms of UTI and need to advise if occurring. Encouraged to limit soda, tea, and coffee and be sure to increase water intake.   RV prn

## 2019-01-01 LAB — URINALYSIS, MICROSCOPIC ONLY
Casts: NONE SEEN /lpf
Epithelial Cells (non renal): >10 /hpf — AB (ref 0–10)
WBC, UA: >30 /hpf — AB (ref 0–5)

## 2019-01-03 ENCOUNTER — Telehealth: Payer: Self-pay

## 2019-01-03 LAB — URINE CULTURE

## 2019-01-03 NOTE — Telephone Encounter (Signed)
-----   Message from Regina Eck, CNM sent at 01/03/2019  7:31 AM EDT ----- Notify patient her urine culture showed E.Coli and the medication she is on she clear this. Patient status

## 2019-01-03 NOTE — Telephone Encounter (Signed)
Patient notified of results. See lab 

## 2019-01-03 NOTE — Telephone Encounter (Signed)
Left message for call back.

## 2019-01-08 DIAGNOSIS — R350 Frequency of micturition: Secondary | ICD-10-CM | POA: Diagnosis not present

## 2019-01-08 DIAGNOSIS — R69 Illness, unspecified: Secondary | ICD-10-CM | POA: Diagnosis not present

## 2019-01-08 DIAGNOSIS — N3946 Mixed incontinence: Secondary | ICD-10-CM | POA: Diagnosis not present

## 2019-01-10 DIAGNOSIS — N3946 Mixed incontinence: Secondary | ICD-10-CM | POA: Diagnosis not present

## 2019-01-10 DIAGNOSIS — R69 Illness, unspecified: Secondary | ICD-10-CM | POA: Diagnosis not present

## 2019-01-30 DIAGNOSIS — M25562 Pain in left knee: Secondary | ICD-10-CM | POA: Diagnosis not present

## 2019-01-30 DIAGNOSIS — G4733 Obstructive sleep apnea (adult) (pediatric): Secondary | ICD-10-CM | POA: Diagnosis not present

## 2019-01-30 DIAGNOSIS — R69 Illness, unspecified: Secondary | ICD-10-CM | POA: Diagnosis not present

## 2019-01-30 DIAGNOSIS — Z0000 Encounter for general adult medical examination without abnormal findings: Secondary | ICD-10-CM | POA: Diagnosis not present

## 2019-01-30 DIAGNOSIS — I10 Essential (primary) hypertension: Secondary | ICD-10-CM | POA: Diagnosis not present

## 2019-01-30 DIAGNOSIS — G609 Hereditary and idiopathic neuropathy, unspecified: Secondary | ICD-10-CM | POA: Diagnosis not present

## 2019-01-30 DIAGNOSIS — N3946 Mixed incontinence: Secondary | ICD-10-CM | POA: Diagnosis not present

## 2019-01-30 DIAGNOSIS — M25561 Pain in right knee: Secondary | ICD-10-CM | POA: Diagnosis not present

## 2019-01-30 DIAGNOSIS — M25511 Pain in right shoulder: Secondary | ICD-10-CM | POA: Diagnosis not present

## 2019-01-30 DIAGNOSIS — M25579 Pain in unspecified ankle and joints of unspecified foot: Secondary | ICD-10-CM | POA: Diagnosis not present

## 2019-01-30 DIAGNOSIS — Z6841 Body Mass Index (BMI) 40.0 and over, adult: Secondary | ICD-10-CM | POA: Diagnosis not present

## 2019-02-14 DIAGNOSIS — R350 Frequency of micturition: Secondary | ICD-10-CM | POA: Diagnosis not present

## 2019-02-14 DIAGNOSIS — N3946 Mixed incontinence: Secondary | ICD-10-CM | POA: Diagnosis not present

## 2019-02-14 DIAGNOSIS — R69 Illness, unspecified: Secondary | ICD-10-CM | POA: Diagnosis not present

## 2019-02-26 DIAGNOSIS — R69 Illness, unspecified: Secondary | ICD-10-CM | POA: Diagnosis not present

## 2019-03-06 DIAGNOSIS — H5212 Myopia, left eye: Secondary | ICD-10-CM | POA: Diagnosis not present

## 2019-03-13 DIAGNOSIS — M65872 Other synovitis and tenosynovitis, left ankle and foot: Secondary | ICD-10-CM | POA: Diagnosis not present

## 2019-03-13 DIAGNOSIS — M79672 Pain in left foot: Secondary | ICD-10-CM | POA: Diagnosis not present

## 2019-03-13 DIAGNOSIS — M19072 Primary osteoarthritis, left ankle and foot: Secondary | ICD-10-CM | POA: Diagnosis not present

## 2019-03-13 DIAGNOSIS — M25572 Pain in left ankle and joints of left foot: Secondary | ICD-10-CM | POA: Diagnosis not present

## 2019-03-17 ENCOUNTER — Other Ambulatory Visit: Payer: Self-pay

## 2019-03-19 ENCOUNTER — Ambulatory Visit (INDEPENDENT_AMBULATORY_CARE_PROVIDER_SITE_OTHER): Payer: Medicare HMO | Admitting: Certified Nurse Midwife

## 2019-03-19 ENCOUNTER — Other Ambulatory Visit: Payer: Self-pay

## 2019-03-19 ENCOUNTER — Other Ambulatory Visit (HOSPITAL_COMMUNITY)
Admission: RE | Admit: 2019-03-19 | Discharge: 2019-03-19 | Disposition: A | Discharge status: Home / Self Care | Payer: Medicare HMO | Source: Ambulatory Visit | Attending: Obstetrics & Gynecology | Admitting: Obstetrics & Gynecology

## 2019-03-19 ENCOUNTER — Encounter: Payer: Self-pay | Admitting: Certified Nurse Midwife

## 2019-03-19 VITALS — BP 130/80 | HR 70 | Temp 97.4°F | Resp 16 | Ht 62.75 in | Wt 234.0 lb

## 2019-03-19 DIAGNOSIS — Z01419 Encounter for gynecological examination (general) (routine) without abnormal findings: Secondary | ICD-10-CM

## 2019-03-19 DIAGNOSIS — Z0000 Encounter for general adult medical examination without abnormal findings: Secondary | ICD-10-CM | POA: Diagnosis not present

## 2019-03-19 DIAGNOSIS — R69 Illness, unspecified: Secondary | ICD-10-CM | POA: Diagnosis not present

## 2019-03-19 DIAGNOSIS — R3915 Urgency of urination: Secondary | ICD-10-CM | POA: Diagnosis not present

## 2019-03-19 DIAGNOSIS — Z1151 Encounter for screening for human papillomavirus (HPV): Secondary | ICD-10-CM | POA: Diagnosis not present

## 2019-03-19 DIAGNOSIS — Z124 Encounter for screening for malignant neoplasm of cervix: Secondary | ICD-10-CM | POA: Insufficient documentation

## 2019-03-19 LAB — POCT URINALYSIS DIPSTICK
Bilirubin, UA: NEGATIVE
Glucose, UA: NEGATIVE
Ketones, UA: NEGATIVE
Nitrite, UA: NEGATIVE
Protein, UA: NEGATIVE
Urobilinogen, UA: NEGATIVE E.U./dL — AB
pH, UA: 5 (ref 5.0–8.0)

## 2019-03-19 NOTE — Progress Notes (Signed)
78 y.o. [id number] Divorced  Caucasian Fe here for annual exam. Menopausal no vaginal bleeding or vaginal dryness. Saw [facility] and was pleased with visit and was put on Mybetrique which seems to help. Saw Dr. Ward Chatters PCP this year already and all stable with hypertension, Vitamin D, asthma, Effexor management. Now seeing Podiatrist for left foot edema. Patient complaining of burning when urine touches skin in vagina only and some urgency. She wearing Depends all the time and is aware this can cause vaginal issues also. Not using coconut oil for dryness consistently. No other health issues today.  Patient's last menstrual period was 06/26/1996.          Sexually active: No.  The current method of family planning is tubal ligation.    Exercising: No.  exercise Smoker:  no  Review of Systems  Genitourinary: Positive for dysuria and frequency.    Health Maintenance: Pap:  03-02-16 neg History of Abnormal Pap: no MMG:  09-06-2018 category b density birads 1:neg Self Breast exams: no Colonoscopy:  2017 neg f/u 57yrs BMD:  2016 normal TDaP:  2014 Shingles: 2020 Pneumonia: 2015 Hep C and HIV: not done Labs: poct urine-wbc 1+, rbc tr   reports that she has never smoked. She has never used smokeless tobacco. She reports that she does not drink alcohol or use drugs.  Past Medical History:  Diagnosis Date  . Abnormal uterine bleeding (AUB)   . Acid reflux   . Depression   . HTN (hypertension)   . Left knee DJD   . Neuropathy   . Obesity   . Post-traumatic osteoarthritis of right knee   . S/P total knee replacement, left   . Sleep apnea    uses CPAP  . SUI (stress urinary incontinence, female)   . Trigger finger     Past Surgical History:  Procedure Laterality Date  . ANKLE FRACTURE SURGERY Left 04   fusion  . APPENDECTOMY    . BACK SURGERY    . BLADDER SURGERY    . BREAST EXCISIONAL BIOPSY Left 1987   No visable scar  . EYE SURGERY Bilateral 14   catarcts  . KNEE  ARTHROSCOPY Left 12/2011  . LUMBAR DISC SURGERY  12/94  . TOTAL KNEE ARTHROPLASTY Left 06/09/2013   DR Thurston Hole  . TOTAL KNEE ARTHROPLASTY Left 06/09/2013   Procedure: TOTAL KNEE ARTHROPLASTY;  Surgeon: Nilda Simmer, MD;  Location: [facility] OR;  Service: Orthopedics;  Laterality: Left;  . TOTAL KNEE ARTHROPLASTY Right 09/11/2016   Procedure: TOTAL KNEE ARTHROPLASTY;  Surgeon: Salvatore Marvel, MD;  Location: [facility] OR;  Service: Orthopedics;  Laterality: Right;  . TUBAL LIGATION Bilateral     Current Outpatient Medications  Medication Sig Dispense Refill  . amLODipine (NORVASC) 5 MG tablet Take 5 mg by mouth daily.    . Aspirin (ASPIR-81 PO) Take by mouth daily.    . calcium carbonate 1250 MG capsule Take 1,250 mg by mouth 2 (two) times daily with a meal.     . cholecalciferol (VITAMIN D) 1000 units tablet Take 1,000 Units by mouth daily.    . ciprofloxacin (CIPRO) 500 MG tablet Take 1 tablet (500 mg total) by mouth 2 (two) times daily. 6 tablet 0  . Cranberry 200 MG CAPS Take by mouth.    . losartan-hydrochlorothiazide (HYZAAR) 100-25 MG tablet Take 1 tablet by mouth daily.    . metoprolol succinate (TOPROL-XL) 25 MG 24 hr tablet 25 mg daily.     . montelukast (SINGULAIR) 10  MG tablet Take 10 mg by mouth at bedtime.     . Multiple Vitamin (MULTIVITAMIN) tablet Take 1 tablet by mouth daily.    . NON FORMULARY CPAP to applied during the hours of sleep    . Omega-3 Fatty Acids (FISH OIL PO) Take by mouth.    Marland Kitchen omeprazole (PRILOSEC) 40 MG capsule Take 40 mg by mouth 2 (two) times daily.     Marland Kitchen tolterodine (DETROL LA) 4 MG 24 hr capsule Take 4 mg by mouth daily.    Marland Kitchen venlafaxine (EFFEXOR) 37.5 MG tablet Take by mouth.    . vitamin C (ASCORBIC ACID) 250 MG tablet Take 250 mg by mouth daily.     No current facility-administered medications for this visit.     Family History  Problem Relation Age of Onset  . Heart disease Mother   . Heart attack Mother   . Stroke Mother   . Heart disease Father   .  Heart attack Father   . Hypertension Other   . Clotting disorder Sister        sister died of a PE post op    ROS:  Pertinent items are noted in HPI.  Otherwise, a comprehensive ROS was negative.  Exam:   LMP 06/26/1996    Ht Readings from Last 3 Encounters:  03/14/18 5\' 3"  (1.6 m)  03/02/17 5\' 3"  (1.6 m)  09/27/16 5\' 3"  (1.6 m)    General appearance: alert, cooperative and appears stated age Head: Normocephalic, without obvious abnormality, atraumatic Neck: no adenopathy, supple, symmetrical, trachea midline and thyroid normal to inspection and palpation Lungs: clear to auscultation bilaterally Breasts: normal appearance, no masses or tenderness, No nipple retraction or dimpling, No nipple discharge or bleeding, No axillary or supraclavicular adenopathy, large pendulous bilateral Heart: regular rate and rhythm Abdomen: soft, non-tender; no masses,  no organomegaly Extremities: extremities normal, atraumatic, no cyanosis or edema Skin: Skin color, texture, turgor normal. No rashes or lesions Lymph nodes: Cervical, supraclavicular, and axillary nodes normal. No abnormal inguinal nodes palpated Neurologic: Grossly normal   Pelvic: External genitalia:  no lesions              Urethra:  normal appearing urethra with no masses, tenderness or lesions              Bartholin's and Skene's: normal                 Vagina: normal appearing vagina with normal color and discharge, no lesions, cystocele noted, no change,               Cervix: no cervical motion tenderness, no lesions and normal appearance              Pap taken: Yes.   Bimanual Exam:  Uterus:  normal size, contour, position, consistency, mobility, non-tender and anteverted              Adnexa: normal adnexa and no mass, fullness, tenderness               Rectovaginal: Confirms               Anus:  normal sphincter tone, no lesions  Chaperone present: yes  A:  Well Woman with normal exam  Post menopausal no HRT  Vaginal  dryness using coconut oil sporadic  R/O UTI history of frequent UTI's  PCP management of Hypertension, cholesterol , Vitamin D, depression  P:   Reviewed health and wellness pertinent to exam  Aware to advise if vaginal bleeding.  Discussed consistent use for best results.  Lab: Urine culture  Continue follow up with MD as indicated.  Pap smear: yes   counseled on breast self exam, mammography screening, feminine hygiene, adequate intake of calcium and vitamin D, diet and exercise, Kegel's exercises  return annually or prn  An After Visit Summary was printed and given to the patient.

## 2019-03-19 NOTE — Patient Instructions (Signed)
EXERCISE AND DIET:  We recommended that you start or continue a regular exercise program for good health. Regular exercise means any activity that makes your heart beat faster and makes you sweat.  We recommend exercising at least 30 minutes per day at least 3 days a week, preferably 4 or 5.  We also recommend a diet low in fat and sugar.  Inactivity, poor dietary choices and obesity can cause diabetes, heart attack, stroke, and kidney damage, among others.   ° °ALCOHOL AND SMOKING:  Women should limit their alcohol intake to no more than 7 drinks/beers/glasses of wine (combined, not each!) per week. Moderation of alcohol intake to this level decreases your risk of breast cancer and liver damage. And of course, no recreational drugs are part of a healthy lifestyle.  And absolutely no smoking or even second hand smoke. Most people know smoking can cause heart and lung diseases, but did you know it also contributes to weakening of your bones? Aging of your skin?  Yellowing of your teeth and nails? ° °CALCIUM AND VITAMIN D:  Adequate intake of calcium and Vitamin D are recommended.  The recommendations for exact amounts of these supplements seem to change often, but generally speaking 600 mg of calcium (either carbonate or citrate) and 800 units of Vitamin D per day seems prudent. Certain women may benefit from higher intake of Vitamin D.  If you are among these women, your doctor will have told you during your visit.   ° °PAP SMEARS:  Pap smears, to check for cervical cancer or precancers,  have traditionally been done yearly, although recent scientific advances have shown that most women can have pap smears less often.  However, every woman still should have a physical exam from her gynecologist every year. It will include a breast check, inspection of the vulva and vagina to check for abnormal growths or skin changes, a visual exam of the cervix, and then an exam to evaluate the size and shape of the uterus and  ovaries.  And after 77 years of age, a rectal exam is indicated to check for rectal cancers. We will also provide age appropriate advice regarding health maintenance, like when you should have certain vaccines, screening for sexually transmitted diseases, bone density testing, colonoscopy, mammograms, etc.  ° °MAMMOGRAMS:  All women over 40 years old should have a yearly mammogram. Many facilities now offer a "3D" mammogram, which may cost around $50 extra out of pocket. If possible,  we recommend you accept the option to have the 3D mammogram performed.  It both reduces the number of women who will be called back for extra views which then turn out to be normal, and it is better than the routine mammogram at detecting truly abnormal areas.   ° °COLONOSCOPY:  Colonoscopy to screen for colon cancer is recommended for all women at age 50.  We know, you hate the idea of the prep.  We agree, BUT, having colon cancer and not knowing it is worse!!  Colon cancer so often starts as a polyp that can be seen and removed at colonscopy, which can quite literally save your life!  And if your first colonoscopy is normal and you have no family history of colon cancer, most women don't have to have it again for 10 years.  Once every ten years, you can do something that may end up saving your life, right?  We will be happy to help you get it scheduled when you are ready.    Be sure to check your insurance coverage so you understand how much it will cost.  It may be covered as a preventative service at no cost, but you should check your particular policy.      Urinary Tract Infection, Adult A urinary tract infection (UTI) is an infection of any part of the urinary tract. The urinary tract includes:  The kidneys.  The ureters.  The bladder.  The urethra. These organs make, store, and get rid of pee (urine) in the body. What are the causes? This is caused by germs (bacteria) in your genital area. These germs grow and  cause swelling (inflammation) of your urinary tract. What increases the risk? You are more likely to develop this condition if:  You have a small, thin tube (catheter) to drain pee.  You cannot control when you pee or poop (incontinence).  You are female, and: ? You use these methods to prevent pregnancy: ? A medicine that kills sperm (spermicide). ? A device that blocks sperm (diaphragm). ? You have low levels of a female hormone (estrogen). ? You are pregnant.  You have genes that add to your risk.  You are sexually active.  You take antibiotic medicines.  You have trouble peeing because of: ? A prostate that is bigger than normal, if you are female. ? A blockage in the part of your body that drains pee from the bladder (urethra). ? A kidney stone. ? A nerve condition that affects your bladder (neurogenic bladder). ? Not getting enough to drink. ? Not peeing often enough.  You have other conditions, such as: ? Diabetes. ? A weak disease-fighting system (immune system). ? Sickle cell disease. ? Gout. ? Injury of the spine. What are the signs or symptoms? Symptoms of this condition include:  Needing to pee right away (urgently).  Peeing often.  Peeing small amounts often.  Pain or burning when peeing.  Blood in the pee.  Pee that smells bad or not like normal.  Trouble peeing.  Pee that is cloudy.  Fluid coming from the vagina, if you are female.  Pain in the belly or lower back. Other symptoms include:  Throwing up (vomiting).  No urge to eat.  Feeling mixed up (confused).  Being tired and grouchy (irritable).  A fever.  Watery poop (diarrhea). How is this treated? This condition may be treated with:  Antibiotic medicine.  Other medicines.  Drinking enough water. Follow these instructions at home:  Medicines  Take over-the-counter and prescription medicines only as told by your doctor.  If you were prescribed an antibiotic medicine,  take it as told by your doctor. Do not stop taking it even if you start to feel better. General instructions  Make sure you: ? Pee until your bladder is empty. ? Do not hold pee for a long time. ? Empty your bladder after sex. ? Wipe from front to back after pooping if you are a female. Use each tissue one time when you wipe.  Drink enough fluid to keep your pee pale yellow.  Keep all follow-up visits as told by your doctor. This is important. Contact a doctor if:  You do not get better after 1-2 days.  Your symptoms go away and then come back. Get help right away if:  You have very bad back pain.  You have very bad pain in your lower belly.  You have a fever.  You are sick to your stomach (nauseous).  You are throwing up. Summary  A  urinary tract infection (UTI) is an infection of any part of the urinary tract.  This condition is caused by germs in your genital area.  There are many risk factors for a UTI. These include having a small, thin tube to drain pee and not being able to control when you pee or poop.  Treatment includes antibiotic medicines for germs.  Drink enough fluid to keep your pee pale yellow. This information is not intended to replace advice given to you by your health care provider. Make sure you discuss any questions you have with your health care provider. Document Released: 11/29/2007 Document Revised: 05/30/2018 Document Reviewed: 12/20/2017 Elsevier Patient Education  2020 [employer].

## 2019-03-21 ENCOUNTER — Other Ambulatory Visit: Payer: Self-pay

## 2019-03-21 LAB — URINE CULTURE

## 2019-03-21 MED ORDER — CIPROFLOXACIN HCL 500 MG PO TABS: 500.0000 mg | ORAL_TABLET | Freq: Two times a day (BID) | ORAL | 0 refills | Status: DC

## 2019-03-27 DIAGNOSIS — M25572 Pain in left ankle and joints of left foot: Secondary | ICD-10-CM | POA: Diagnosis not present

## 2019-03-27 DIAGNOSIS — M65872 Other synovitis and tenosynovitis, left ankle and foot: Secondary | ICD-10-CM | POA: Diagnosis not present

## 2019-03-27 LAB — CYTOLOGY - PAP
Diagnosis: NEGATIVE
High risk HPV: NEGATIVE
Molecular Disclaimer: 56
Molecular Disclaimer: NORMAL

## 2019-03-28 DIAGNOSIS — R350 Frequency of micturition: Secondary | ICD-10-CM | POA: Diagnosis not present

## 2019-03-28 DIAGNOSIS — N3946 Mixed incontinence: Secondary | ICD-10-CM | POA: Diagnosis not present

## 2019-04-02 DIAGNOSIS — H02834 Dermatochalasis of left upper eyelid: Secondary | ICD-10-CM | POA: Diagnosis not present

## 2019-04-02 DIAGNOSIS — H16223 Keratoconjunctivitis sicca, not specified as Sjogren's, bilateral: Secondary | ICD-10-CM | POA: Diagnosis not present

## 2019-04-02 DIAGNOSIS — Z961 Presence of intraocular lens: Secondary | ICD-10-CM | POA: Diagnosis not present

## 2019-04-02 DIAGNOSIS — H02831 Dermatochalasis of right upper eyelid: Secondary | ICD-10-CM | POA: Diagnosis not present

## 2019-04-18 DIAGNOSIS — Z1159 Encounter for screening for other viral diseases: Secondary | ICD-10-CM | POA: Diagnosis not present

## 2019-05-05 DIAGNOSIS — M25572 Pain in left ankle and joints of left foot: Secondary | ICD-10-CM | POA: Diagnosis not present

## 2019-05-28 DIAGNOSIS — R69 Illness, unspecified: Secondary | ICD-10-CM | POA: Diagnosis not present

## 2019-05-28 DIAGNOSIS — N3946 Mixed incontinence: Secondary | ICD-10-CM | POA: Diagnosis not present

## 2019-07-31 ENCOUNTER — Other Ambulatory Visit: Payer: Self-pay | Admitting: Internal Medicine

## 2019-07-31 DIAGNOSIS — Z1231 Encounter for screening mammogram for malignant neoplasm of breast: Secondary | ICD-10-CM

## 2019-08-18 ENCOUNTER — Other Ambulatory Visit: Payer: Self-pay

## 2019-08-18 NOTE — Progress Notes (Signed)
79 y.o. Divorced Caucasian female [phone] here with complaint of UTI, with onset  on one week ago. Patient complaining of urinary frequency/urgency/ and but no pain with urination. Patient denies fever, chills, nausea or back pain. No new personal products. Patient has been seen by Urology for stress incontinence and now on Toviaz which has been working very well.,It has not helped this time and she felt it might be a UTI.Marland Kitchen She has noted urine odor which is usually her symptom with infection. Patient is not sexual active. Denies any vaginal symptoms other than occasional vaginal dryness, using coconut oil for. Menopausal no vaginal bleeding.  Patient drinking adequate water intake.  Review of Systems  Constitutional: Negative.   HENT: Negative.   Eyes: Negative.   Respiratory: Negative.   Cardiovascular: Negative.   Gastrointestinal: Negative.   Genitourinary: Positive for frequency.       Odor  Musculoskeletal: Negative.   Skin: Negative.   Neurological: Negative.   Endo/Heme/Allergies: Negative.   Psychiatric/Behavioral: Negative.     O: Healthy female WDWN Affect: Normal, orientation x 3 Skin : warm and dry CVAT: negative bilateral Abdomen: Soft no masses, negative for suprapubic tenderness External genital : normal appearance, no lichen sclerosis flare Urethral meatus red, slightly tender, Urethra, bladder tender Vagina: scant moisture Cervix normal appearance Uterus: normal, non tender, no masses Adnexa: no fullness or masses or tenderness Rectal: no lesions noted  P: Reviewed findings of UTI and need for treatment. XT:GGYIR 500 mg bid x 3 days, warnings given with use. SWN:IOEVO micro, culture Reviewed warning signs and symptoms of UTI and need to advise if occurring. Encouraged to limit soda, tea, and coffee and be sure to increase water intake. Follow up with Urology if does not resolve. Patient agreeable.   RV prn

## 2019-08-19 ENCOUNTER — Other Ambulatory Visit: Payer: Self-pay

## 2019-08-19 ENCOUNTER — Encounter: Payer: Self-pay | Admitting: Certified Nurse Midwife

## 2019-08-19 ENCOUNTER — Ambulatory Visit (INDEPENDENT_AMBULATORY_CARE_PROVIDER_SITE_OTHER): Payer: Medicare HMO | Admitting: Certified Nurse Midwife

## 2019-08-19 VITALS — BP 114/62 | HR 72 | Temp 97.9°F | Resp 16 | Wt 231.0 lb

## 2019-08-19 DIAGNOSIS — N390 Urinary tract infection, site not specified: Secondary | ICD-10-CM | POA: Diagnosis not present

## 2019-08-19 DIAGNOSIS — N951 Menopausal and female climacteric states: Secondary | ICD-10-CM | POA: Diagnosis not present

## 2019-08-19 LAB — POCT URINALYSIS DIPSTICK
Bilirubin, UA: NEGATIVE
Blood, UA: NEGATIVE
Glucose, UA: NEGATIVE
Ketones, UA: NEGATIVE
Nitrite, UA: NEGATIVE
Protein, UA: NEGATIVE
Urobilinogen, UA: NEGATIVE E.U./dL — AB
pH, UA: 5 (ref 5.0–8.0)

## 2019-08-19 MED ORDER — CIPROFLOXACIN HCL 500 MG PO TABS: ORAL_TABLET | ORAL | 0 refills | Status: DC

## 2019-08-19 NOTE — Patient Instructions (Signed)
Urinary Tract Infection, Adult A urinary tract infection (UTI) is an infection of any part of the urinary tract. The urinary tract includes:  The kidneys.  The ureters.  The bladder.  The urethra. These organs make, store, and get rid of pee (urine) in the body. What are the causes? This is caused by germs (bacteria) in your genital area. These germs grow and cause swelling (inflammation) of your urinary tract. What increases the risk? You are more likely to develop this condition if:  You have a small, thin tube (catheter) to drain pee.  You cannot control when you pee or poop (incontinence).  You are female, and: ? You use these methods to prevent pregnancy:  A medicine that kills sperm (spermicide).  A device that blocks sperm (diaphragm). ? You have low levels of a female hormone (estrogen). ? You are pregnant.  You have genes that add to your risk.  You are sexually active.  You take antibiotic medicines.  You have trouble peeing because of: ? A prostate that is bigger than normal, if you are female. ? A blockage in the part of your body that drains pee from the bladder (urethra). ? A kidney stone. ? A nerve condition that affects your bladder (neurogenic bladder). ? Not getting enough to drink. ? Not peeing often enough.  You have other conditions, such as: ? Diabetes. ? A weak disease-fighting system (immune system). ? Sickle cell disease. ? Gout. ? Injury of the spine. What are the signs or symptoms? Symptoms of this condition include:  Needing to pee right away (urgently).  Peeing often.  Peeing small amounts often.  Pain or burning when peeing.  Blood in the pee.  Pee that smells bad or not like normal.  Trouble peeing.  Pee that is cloudy.  Fluid coming from the vagina, if you are female.  Pain in the belly or lower back. Other symptoms include:  Throwing up (vomiting).  No urge to eat.  Feeling mixed up (confused).  Being tired  and grouchy (irritable).  A fever.  Watery poop (diarrhea). How is this treated? This condition may be treated with:  Antibiotic medicine.  Other medicines.  Drinking enough water. Follow these instructions at home:  Medicines  Take over-the-counter and prescription medicines only as told by your doctor.  If you were prescribed an antibiotic medicine, take it as told by your doctor. Do not stop taking it even if you start to feel better. General instructions  Make sure you: ? Pee until your bladder is empty. ? Do not hold pee for a long time. ? Empty your bladder after sex. ? Wipe from front to back after pooping if you are a female. Use each tissue one time when you wipe.  Drink enough fluid to keep your pee pale yellow.  Keep all follow-up visits as told by your doctor. This is important. Contact a doctor if:  You do not get better after 1-2 days.  Your symptoms go away and then come back. Get help right away if:  You have very bad back pain.  You have very bad pain in your lower belly.  You have a fever.  You are sick to your stomach (nauseous).  You are throwing up. Summary  A urinary tract infection (UTI) is an infection of any part of the urinary tract.  This condition is caused by germs in your genital area.  There are many risk factors for a UTI. These include having a small, thin   tube to drain pee and not being able to control when you pee or poop.  Treatment includes antibiotic medicines for germs.  Drink enough fluid to keep your pee pale yellow. This information is not intended to replace advice given to you by your health care provider. Make sure you discuss any questions you have with your health care provider. Document Revised: 05/30/2018 Document Reviewed: 12/20/2017 Elsevier Patient Education  2020 [employer]  

## 2019-08-20 LAB — URINALYSIS, MICROSCOPIC ONLY
Casts: NONE SEEN /lpf
WBC, UA: >30 /hpf — AB (ref 0–5)

## 2019-08-21 LAB — URINE CULTURE

## 2019-09-08 ENCOUNTER — Ambulatory Visit
Admission: RE | Admit: 2019-09-08 | Discharge: 2019-09-08 | Disposition: A | Discharge status: Home / Self Care | Payer: Medicare HMO | Source: Ambulatory Visit | Attending: Internal Medicine | Admitting: Internal Medicine

## 2019-09-08 ENCOUNTER — Other Ambulatory Visit: Payer: Self-pay

## 2019-09-08 DIAGNOSIS — Z1231 Encounter for screening mammogram for malignant neoplasm of breast: Secondary | ICD-10-CM

## 2019-09-17 ENCOUNTER — Encounter: Payer: Self-pay | Admitting: Certified Nurse Midwife

## 2020-03-22 ENCOUNTER — Ambulatory Visit: Payer: Medicare HMO | Admitting: Certified Nurse Midwife

## 2020-06-29 DIAGNOSIS — R42 Dizziness and giddiness: Secondary | ICD-10-CM | POA: Diagnosis not present

## 2020-06-29 DIAGNOSIS — R262 Difficulty in walking, not elsewhere classified: Secondary | ICD-10-CM | POA: Diagnosis not present

## 2020-06-29 DIAGNOSIS — Z9181 History of falling: Secondary | ICD-10-CM | POA: Diagnosis not present

## 2020-06-29 DIAGNOSIS — R2681 Unsteadiness on feet: Secondary | ICD-10-CM | POA: Diagnosis not present

## 2020-06-29 DIAGNOSIS — M6281 Muscle weakness (generalized): Secondary | ICD-10-CM | POA: Diagnosis not present

## 2020-06-30 DIAGNOSIS — M6281 Muscle weakness (generalized): Secondary | ICD-10-CM | POA: Diagnosis not present

## 2020-06-30 DIAGNOSIS — R262 Difficulty in walking, not elsewhere classified: Secondary | ICD-10-CM | POA: Diagnosis not present

## 2020-06-30 DIAGNOSIS — Z9181 History of falling: Secondary | ICD-10-CM | POA: Diagnosis not present

## 2020-06-30 DIAGNOSIS — R2681 Unsteadiness on feet: Secondary | ICD-10-CM | POA: Diagnosis not present

## 2020-06-30 DIAGNOSIS — R42 Dizziness and giddiness: Secondary | ICD-10-CM | POA: Diagnosis not present

## 2020-07-05 DIAGNOSIS — R262 Difficulty in walking, not elsewhere classified: Secondary | ICD-10-CM | POA: Diagnosis not present

## 2020-07-05 DIAGNOSIS — R2681 Unsteadiness on feet: Secondary | ICD-10-CM | POA: Diagnosis not present

## 2020-07-05 DIAGNOSIS — M6281 Muscle weakness (generalized): Secondary | ICD-10-CM | POA: Diagnosis not present

## 2020-07-05 DIAGNOSIS — R42 Dizziness and giddiness: Secondary | ICD-10-CM | POA: Diagnosis not present

## 2020-07-05 DIAGNOSIS — Z9181 History of falling: Secondary | ICD-10-CM | POA: Diagnosis not present

## 2020-07-08 DIAGNOSIS — R2681 Unsteadiness on feet: Secondary | ICD-10-CM | POA: Diagnosis not present

## 2020-07-08 DIAGNOSIS — R42 Dizziness and giddiness: Secondary | ICD-10-CM | POA: Diagnosis not present

## 2020-07-08 DIAGNOSIS — M6281 Muscle weakness (generalized): Secondary | ICD-10-CM | POA: Diagnosis not present

## 2020-07-08 DIAGNOSIS — R262 Difficulty in walking, not elsewhere classified: Secondary | ICD-10-CM | POA: Diagnosis not present

## 2020-07-08 DIAGNOSIS — Z9181 History of falling: Secondary | ICD-10-CM | POA: Diagnosis not present

## 2020-07-15 DIAGNOSIS — M6281 Muscle weakness (generalized): Secondary | ICD-10-CM | POA: Diagnosis not present

## 2020-07-15 DIAGNOSIS — Z9181 History of falling: Secondary | ICD-10-CM | POA: Diagnosis not present

## 2020-07-15 DIAGNOSIS — R2681 Unsteadiness on feet: Secondary | ICD-10-CM | POA: Diagnosis not present

## 2020-07-15 DIAGNOSIS — R42 Dizziness and giddiness: Secondary | ICD-10-CM | POA: Diagnosis not present

## 2020-07-15 DIAGNOSIS — R262 Difficulty in walking, not elsewhere classified: Secondary | ICD-10-CM | POA: Diagnosis not present

## 2020-07-19 DIAGNOSIS — M6281 Muscle weakness (generalized): Secondary | ICD-10-CM | POA: Diagnosis not present

## 2020-07-19 DIAGNOSIS — Z9181 History of falling: Secondary | ICD-10-CM | POA: Diagnosis not present

## 2020-07-19 DIAGNOSIS — R42 Dizziness and giddiness: Secondary | ICD-10-CM | POA: Diagnosis not present

## 2020-07-19 DIAGNOSIS — R262 Difficulty in walking, not elsewhere classified: Secondary | ICD-10-CM | POA: Diagnosis not present

## 2020-07-19 DIAGNOSIS — R2681 Unsteadiness on feet: Secondary | ICD-10-CM | POA: Diagnosis not present

## 2020-07-27 DIAGNOSIS — R2681 Unsteadiness on feet: Secondary | ICD-10-CM | POA: Diagnosis not present

## 2020-07-27 DIAGNOSIS — R42 Dizziness and giddiness: Secondary | ICD-10-CM | POA: Diagnosis not present

## 2020-07-27 DIAGNOSIS — M6281 Muscle weakness (generalized): Secondary | ICD-10-CM | POA: Diagnosis not present

## 2020-07-27 DIAGNOSIS — R262 Difficulty in walking, not elsewhere classified: Secondary | ICD-10-CM | POA: Diagnosis not present

## 2020-07-27 DIAGNOSIS — Z9181 History of falling: Secondary | ICD-10-CM | POA: Diagnosis not present

## 2020-08-04 DIAGNOSIS — R42 Dizziness and giddiness: Secondary | ICD-10-CM | POA: Diagnosis not present

## 2020-08-04 DIAGNOSIS — R2681 Unsteadiness on feet: Secondary | ICD-10-CM | POA: Diagnosis not present

## 2020-08-04 DIAGNOSIS — M6281 Muscle weakness (generalized): Secondary | ICD-10-CM | POA: Diagnosis not present

## 2020-08-04 DIAGNOSIS — Z9181 History of falling: Secondary | ICD-10-CM | POA: Diagnosis not present

## 2020-08-04 DIAGNOSIS — R262 Difficulty in walking, not elsewhere classified: Secondary | ICD-10-CM | POA: Diagnosis not present

## 2020-08-05 DIAGNOSIS — Z9181 History of falling: Secondary | ICD-10-CM | POA: Diagnosis not present

## 2020-08-05 DIAGNOSIS — M6281 Muscle weakness (generalized): Secondary | ICD-10-CM | POA: Diagnosis not present

## 2020-08-05 DIAGNOSIS — R262 Difficulty in walking, not elsewhere classified: Secondary | ICD-10-CM | POA: Diagnosis not present

## 2020-08-05 DIAGNOSIS — R42 Dizziness and giddiness: Secondary | ICD-10-CM | POA: Diagnosis not present

## 2020-08-05 DIAGNOSIS — R2681 Unsteadiness on feet: Secondary | ICD-10-CM | POA: Diagnosis not present

## 2020-08-09 ENCOUNTER — Other Ambulatory Visit: Payer: Self-pay | Admitting: *Deleted

## 2020-08-09 ENCOUNTER — Other Ambulatory Visit: Payer: Self-pay | Admitting: Internal Medicine

## 2020-08-09 DIAGNOSIS — Z1231 Encounter for screening mammogram for malignant neoplasm of breast: Secondary | ICD-10-CM

## 2020-08-10 DIAGNOSIS — Z9181 History of falling: Secondary | ICD-10-CM | POA: Diagnosis not present

## 2020-08-10 DIAGNOSIS — R42 Dizziness and giddiness: Secondary | ICD-10-CM | POA: Diagnosis not present

## 2020-08-10 DIAGNOSIS — R262 Difficulty in walking, not elsewhere classified: Secondary | ICD-10-CM | POA: Diagnosis not present

## 2020-08-10 DIAGNOSIS — M6281 Muscle weakness (generalized): Secondary | ICD-10-CM | POA: Diagnosis not present

## 2020-08-10 DIAGNOSIS — R2681 Unsteadiness on feet: Secondary | ICD-10-CM | POA: Diagnosis not present

## 2020-08-11 DIAGNOSIS — Z9181 History of falling: Secondary | ICD-10-CM | POA: Diagnosis not present

## 2020-08-11 DIAGNOSIS — M6281 Muscle weakness (generalized): Secondary | ICD-10-CM | POA: Diagnosis not present

## 2020-08-11 DIAGNOSIS — R42 Dizziness and giddiness: Secondary | ICD-10-CM | POA: Diagnosis not present

## 2020-08-11 DIAGNOSIS — R2681 Unsteadiness on feet: Secondary | ICD-10-CM | POA: Diagnosis not present

## 2020-08-11 DIAGNOSIS — R262 Difficulty in walking, not elsewhere classified: Secondary | ICD-10-CM | POA: Diagnosis not present

## 2020-08-16 DIAGNOSIS — R2681 Unsteadiness on feet: Secondary | ICD-10-CM | POA: Diagnosis not present

## 2020-08-16 DIAGNOSIS — R262 Difficulty in walking, not elsewhere classified: Secondary | ICD-10-CM | POA: Diagnosis not present

## 2020-08-16 DIAGNOSIS — M6281 Muscle weakness (generalized): Secondary | ICD-10-CM | POA: Diagnosis not present

## 2020-08-16 DIAGNOSIS — R42 Dizziness and giddiness: Secondary | ICD-10-CM | POA: Diagnosis not present

## 2020-08-16 DIAGNOSIS — Z9181 History of falling: Secondary | ICD-10-CM | POA: Diagnosis not present

## 2020-08-18 DIAGNOSIS — R262 Difficulty in walking, not elsewhere classified: Secondary | ICD-10-CM | POA: Diagnosis not present

## 2020-08-18 DIAGNOSIS — R2681 Unsteadiness on feet: Secondary | ICD-10-CM | POA: Diagnosis not present

## 2020-08-18 DIAGNOSIS — R42 Dizziness and giddiness: Secondary | ICD-10-CM | POA: Diagnosis not present

## 2020-08-18 DIAGNOSIS — Z9181 History of falling: Secondary | ICD-10-CM | POA: Diagnosis not present

## 2020-08-18 DIAGNOSIS — M6281 Muscle weakness (generalized): Secondary | ICD-10-CM | POA: Diagnosis not present

## 2020-08-23 DIAGNOSIS — R42 Dizziness and giddiness: Secondary | ICD-10-CM | POA: Diagnosis not present

## 2020-08-23 DIAGNOSIS — R262 Difficulty in walking, not elsewhere classified: Secondary | ICD-10-CM | POA: Diagnosis not present

## 2020-08-23 DIAGNOSIS — Z9181 History of falling: Secondary | ICD-10-CM | POA: Diagnosis not present

## 2020-08-23 DIAGNOSIS — M6281 Muscle weakness (generalized): Secondary | ICD-10-CM | POA: Diagnosis not present

## 2020-08-23 DIAGNOSIS — R2681 Unsteadiness on feet: Secondary | ICD-10-CM | POA: Diagnosis not present

## 2020-08-25 DIAGNOSIS — R2681 Unsteadiness on feet: Secondary | ICD-10-CM | POA: Diagnosis not present

## 2020-08-25 DIAGNOSIS — R42 Dizziness and giddiness: Secondary | ICD-10-CM | POA: Diagnosis not present

## 2020-08-25 DIAGNOSIS — Z9181 History of falling: Secondary | ICD-10-CM | POA: Diagnosis not present

## 2020-08-25 DIAGNOSIS — R262 Difficulty in walking, not elsewhere classified: Secondary | ICD-10-CM | POA: Diagnosis not present

## 2020-08-25 DIAGNOSIS — M6281 Muscle weakness (generalized): Secondary | ICD-10-CM | POA: Diagnosis not present

## 2020-09-16 DIAGNOSIS — M545 Low back pain, unspecified: Secondary | ICD-10-CM | POA: Diagnosis not present

## 2020-09-16 DIAGNOSIS — Z0000 Encounter for general adult medical examination without abnormal findings: Secondary | ICD-10-CM | POA: Diagnosis not present

## 2020-09-16 DIAGNOSIS — I10 Essential (primary) hypertension: Secondary | ICD-10-CM | POA: Diagnosis not present

## 2020-09-16 DIAGNOSIS — G609 Hereditary and idiopathic neuropathy, unspecified: Secondary | ICD-10-CM | POA: Diagnosis not present

## 2020-09-22 DIAGNOSIS — M8589 Other specified disorders of bone density and structure, multiple sites: Secondary | ICD-10-CM | POA: Diagnosis not present

## 2020-09-22 DIAGNOSIS — Z780 Asymptomatic menopausal state: Secondary | ICD-10-CM | POA: Diagnosis not present

## 2020-09-28 ENCOUNTER — Other Ambulatory Visit: Payer: Self-pay

## 2020-09-28 ENCOUNTER — Ambulatory Visit
Admission: RE | Admit: 2020-09-28 | Discharge: 2020-09-28 | Disposition: A | Discharge status: Home / Self Care | Payer: Medicare HMO | Source: Ambulatory Visit | Attending: *Deleted | Admitting: *Deleted

## 2020-09-28 DIAGNOSIS — Z1231 Encounter for screening mammogram for malignant neoplasm of breast: Secondary | ICD-10-CM

## 2020-09-30 DIAGNOSIS — L918 Other hypertrophic disorders of the skin: Secondary | ICD-10-CM | POA: Diagnosis not present

## 2020-09-30 DIAGNOSIS — L280 Lichen simplex chronicus: Secondary | ICD-10-CM | POA: Diagnosis not present

## 2020-09-30 DIAGNOSIS — L2089 Other atopic dermatitis: Secondary | ICD-10-CM | POA: Diagnosis not present

## 2020-11-03 DIAGNOSIS — F3342 Major depressive disorder, recurrent, in full remission: Secondary | ICD-10-CM | POA: Diagnosis not present

## 2020-11-03 DIAGNOSIS — M545 Low back pain, unspecified: Secondary | ICD-10-CM | POA: Diagnosis not present

## 2020-11-03 DIAGNOSIS — G609 Hereditary and idiopathic neuropathy, unspecified: Secondary | ICD-10-CM | POA: Diagnosis not present

## 2020-11-04 DIAGNOSIS — H903 Sensorineural hearing loss, bilateral: Secondary | ICD-10-CM | POA: Diagnosis not present

## 2021-01-27 DIAGNOSIS — N3000 Acute cystitis without hematuria: Secondary | ICD-10-CM | POA: Diagnosis not present

## 2021-01-27 DIAGNOSIS — N3946 Mixed incontinence: Secondary | ICD-10-CM | POA: Diagnosis not present

## 2021-03-07 DIAGNOSIS — I10 Essential (primary) hypertension: Secondary | ICD-10-CM | POA: Diagnosis not present

## 2021-03-07 DIAGNOSIS — G8929 Other chronic pain: Secondary | ICD-10-CM | POA: Diagnosis not present

## 2021-03-07 DIAGNOSIS — F419 Anxiety disorder, unspecified: Secondary | ICD-10-CM | POA: Diagnosis not present

## 2021-03-07 DIAGNOSIS — K219 Gastro-esophageal reflux disease without esophagitis: Secondary | ICD-10-CM | POA: Diagnosis not present

## 2021-03-07 DIAGNOSIS — K5900 Constipation, unspecified: Secondary | ICD-10-CM | POA: Diagnosis not present

## 2021-03-07 DIAGNOSIS — G4733 Obstructive sleep apnea (adult) (pediatric): Secondary | ICD-10-CM | POA: Diagnosis not present

## 2021-03-07 DIAGNOSIS — G629 Polyneuropathy, unspecified: Secondary | ICD-10-CM | POA: Diagnosis not present

## 2021-03-07 DIAGNOSIS — F324 Major depressive disorder, single episode, in partial remission: Secondary | ICD-10-CM | POA: Diagnosis not present

## 2021-03-07 DIAGNOSIS — J42 Unspecified chronic bronchitis: Secondary | ICD-10-CM | POA: Diagnosis not present

## 2021-03-07 DIAGNOSIS — I25119 Atherosclerotic heart disease of native coronary artery with unspecified angina pectoris: Secondary | ICD-10-CM | POA: Diagnosis not present

## 2021-03-07 DIAGNOSIS — I739 Peripheral vascular disease, unspecified: Secondary | ICD-10-CM | POA: Diagnosis not present

## 2021-03-09 DIAGNOSIS — R079 Chest pain, unspecified: Secondary | ICD-10-CM | POA: Diagnosis not present

## 2021-03-09 DIAGNOSIS — M1710 Unilateral primary osteoarthritis, unspecified knee: Secondary | ICD-10-CM | POA: Diagnosis not present

## 2021-03-09 DIAGNOSIS — G609 Hereditary and idiopathic neuropathy, unspecified: Secondary | ICD-10-CM | POA: Diagnosis not present

## 2021-03-09 DIAGNOSIS — R490 Dysphonia: Secondary | ICD-10-CM | POA: Diagnosis not present

## 2021-03-09 DIAGNOSIS — R059 Cough, unspecified: Secondary | ICD-10-CM | POA: Diagnosis not present

## 2021-03-09 DIAGNOSIS — M65331 Trigger finger, right middle finger: Secondary | ICD-10-CM | POA: Diagnosis not present

## 2021-03-09 DIAGNOSIS — I10 Essential (primary) hypertension: Secondary | ICD-10-CM | POA: Diagnosis not present

## 2021-03-09 DIAGNOSIS — N390 Urinary tract infection, site not specified: Secondary | ICD-10-CM | POA: Diagnosis not present

## 2021-03-09 DIAGNOSIS — G4733 Obstructive sleep apnea (adult) (pediatric): Secondary | ICD-10-CM | POA: Diagnosis not present

## 2021-03-09 DIAGNOSIS — M722 Plantar fascial fibromatosis: Secondary | ICD-10-CM | POA: Diagnosis not present

## 2021-03-09 DIAGNOSIS — F5101 Primary insomnia: Secondary | ICD-10-CM | POA: Diagnosis not present

## 2021-03-09 DIAGNOSIS — M21962 Unspecified acquired deformity of left lower leg: Secondary | ICD-10-CM | POA: Diagnosis not present

## 2021-03-22 DIAGNOSIS — G4733 Obstructive sleep apnea (adult) (pediatric): Secondary | ICD-10-CM | POA: Diagnosis not present

## 2021-03-22 DIAGNOSIS — I10 Essential (primary) hypertension: Secondary | ICD-10-CM | POA: Diagnosis not present

## 2021-03-22 DIAGNOSIS — R079 Chest pain, unspecified: Secondary | ICD-10-CM | POA: Diagnosis not present

## 2021-03-22 DIAGNOSIS — E669 Obesity, unspecified: Secondary | ICD-10-CM | POA: Diagnosis not present

## 2021-06-02 ENCOUNTER — Ambulatory Visit (HOSPITAL_BASED_OUTPATIENT_CLINIC_OR_DEPARTMENT_OTHER): Payer: BC Managed Care – PPO | Admitting: Obstetrics & Gynecology

## 2021-06-06 ENCOUNTER — Ambulatory Visit (INDEPENDENT_AMBULATORY_CARE_PROVIDER_SITE_OTHER): Payer: Medicare HMO | Admitting: Obstetrics & Gynecology

## 2021-06-06 ENCOUNTER — Other Ambulatory Visit: Payer: Self-pay

## 2021-06-06 ENCOUNTER — Encounter (HOSPITAL_BASED_OUTPATIENT_CLINIC_OR_DEPARTMENT_OTHER): Payer: Self-pay | Admitting: Obstetrics & Gynecology

## 2021-06-06 VITALS — BP 147/80 | HR 78 | Ht 64.0 in | Wt 208.0 lb

## 2021-06-06 DIAGNOSIS — N3281 Overactive bladder: Secondary | ICD-10-CM | POA: Diagnosis not present

## 2021-06-06 DIAGNOSIS — Z7689 Persons encountering health services in other specified circumstances: Secondary | ICD-10-CM

## 2021-06-06 NOTE — Patient Instructions (Signed)
[facility] [place] [address] [place], [place] [address]  Main: [phone]   

## 2021-06-06 NOTE — Progress Notes (Signed)
80 y.o. G23P3003 Divorced White or Caucasian female here for new patient.  She has not been seen by gyn in many years.  Denies vaginal bleeding.  Was not on HRT in the past.  Last MMG was 09/2020.  Unsure about last colonoscopy.  Had bone density done in the past with PCP.    PCP, Dr. Brynda Rim, has retired and his office is closed.  He retired about a year and a half ago.  Would like names for PCP.  Discussed options in the [facility] area.  Patient's last menstrual period was 06/26/1996.          Sexually active: No.  The current method of family planning is post menopausal status.    Exercising: No.   But does work in her garden Smoker:  no  Health Maintenance: Pap:  unsure of last one History of abnormal Pap:  no MMG:  09/2020 Colonoscopy:  was years ago.  Was advised by GI to not have screening unless there was a problem BMD: done with PCP Screening Labs: done within the last 6 months   reports that she has never smoked. She has never used smokeless tobacco. She reports that she does not drink alcohol and does not use drugs.  Past Medical History:  Diagnosis Date   Abnormal uterine bleeding (AUB)    Acid reflux    Depression    HTN (hypertension)    Left knee DJD    Neuropathy    Obesity    Post-traumatic osteoarthritis of right knee    S/P total knee replacement, left    Sleep apnea    uses CPAP   SUI (stress urinary incontinence, female)    Trigger finger     Past Surgical History:  Procedure Laterality Date   ANKLE FRACTURE SURGERY Left 04   fusion   APPENDECTOMY     BACK SURGERY     BLADDER SURGERY     BREAST EXCISIONAL BIOPSY Left 1987   No visable scar   EYE SURGERY Bilateral 14   catarcts   KNEE ARTHROSCOPY Left 12/2011   LUMBAR DISC SURGERY  12/94   TOTAL KNEE ARTHROPLASTY Left 06/09/2013   DR Thurston Hole   TOTAL KNEE ARTHROPLASTY Left 06/09/2013   Procedure: TOTAL KNEE ARTHROPLASTY;  Surgeon: Nilda Simmer, MD;  Location: [facility] OR;  Service: Orthopedics;   Laterality: Left;   TOTAL KNEE ARTHROPLASTY Right 09/11/2016   Procedure: TOTAL KNEE ARTHROPLASTY;  Surgeon: Salvatore Marvel, MD;  Location: [facility] OR;  Service: Orthopedics;  Laterality: Right;   TUBAL LIGATION Bilateral     Current Outpatient Medications  Medication Sig Dispense Refill   alclomethasone (ACLOVATE) 0.05 % ointment      amLODipine (NORVASC) 5 MG tablet Take 5 mg by mouth daily.     Aspirin (ASPIR-81 PO) Take by mouth daily.     calcium carbonate 1250 MG capsule Take 1,250 mg by mouth 2 (two) times daily with a meal.      cholecalciferol (VITAMIN D) 1000 units tablet Take 1,000 Units by mouth daily.     Cranberry 200 MG CAPS Take by mouth.     hydrochlorothiazide (HYDRODIURIL) 25 MG tablet      losartan (COZAAR) 100 MG tablet      metoprolol succinate (TOPROL-XL) 25 MG 24 hr tablet 25 mg daily.      Multiple Vitamin (MULTIVITAMIN) tablet Take 1 tablet by mouth daily.     NON FORMULARY CPAP to applied during the hours of sleep  omeprazole (PRILOSEC) 40 MG capsule Take 40 mg by mouth 2 (two) times daily.      TOVIAZ 8 MG TB24 tablet Take 8 mg by mouth daily.     venlafaxine (EFFEXOR) 37.5 MG tablet Take by mouth.     vitamin C (ASCORBIC ACID) 250 MG tablet Take 250 mg by mouth daily.     No current facility-administered medications for this visit.    Family History  Problem Relation Age of Onset   Heart disease Mother    Heart attack Mother    Stroke Mother    Heart disease Father    Heart attack Father    Hypertension Other    Clotting disorder Sister        sister died of a PE post op    Review of Systems  All other systems reviewed and are negative.  Exam:   BP (!) 147/80   Pulse 78   Ht 5\' 4"  (1.626 m)   Wt 208 lb (94.3 kg)   LMP 06/26/1996   BMI 35.70 kg/m   Height: 5\' 4"  (162.6 cm)  General appearance: alert, cooperative and appears stated age Head: Normocephalic, without obvious abnormality, atraumatic Neck: no adenopathy, supple, symmetrical,  trachea midline and thyroid normal to inspection and palpation Lungs: clear to auscultation bilaterally Breasts: normal appearance, no masses or tenderness Heart: regular rate and rhythm Abdomen: soft, non-tender; bowel sounds normal; no masses,  no organomegaly Extremities: extremities normal, atraumatic, no cyanosis or edema Skin: Skin color, texture, turgor normal. No rashes or lesions Lymph nodes: Cervical, supraclavicular, and axillary nodes normal. No abnormal inguinal nodes palpated Neurologic: Grossly normal  Pelvic: pt declined pelvic exam  Chaperone, 08/24/1996, RN, was present for exam.  Assessment/Plan: 1. Establishing care with new doctor, encounter for - discussed with pt screening recommendations for MMG (can go to every other year), pap smears (not indicated), pelvic exams (not good screening tests for ovarian cancer.  We discussed when to be seen again and reasons to be seen sooner.  Pt desires to stay and established pt and wants to come back in 2 years.   - PCP names given as well - vaccinations reviewed.  Pt has had covid vaccinations but does not have dates with her - BMD done 09/22/2020 and in Epic.  Mild osteopenia present.  Repeat 3 - 4 years  2. OAB (overactive bladder) - on Toviaz - followed by Dr. Raechel Ache  3.  H/o lichen sclerosus - denies symptoms.  Declines having a pelvic exam today.

## 2021-06-07 DIAGNOSIS — L814 Other melanin hyperpigmentation: Secondary | ICD-10-CM | POA: Diagnosis not present

## 2021-06-07 DIAGNOSIS — L820 Inflamed seborrheic keratosis: Secondary | ICD-10-CM | POA: Diagnosis not present

## 2021-06-07 DIAGNOSIS — D1801 Hemangioma of skin and subcutaneous tissue: Secondary | ICD-10-CM | POA: Diagnosis not present

## 2021-06-07 DIAGNOSIS — L218 Other seborrheic dermatitis: Secondary | ICD-10-CM | POA: Diagnosis not present

## 2021-06-07 DIAGNOSIS — X32XXXS Exposure to sunlight, sequela: Secondary | ICD-10-CM | POA: Diagnosis not present

## 2021-09-19 ENCOUNTER — Other Ambulatory Visit: Payer: Self-pay | Admitting: *Deleted

## 2021-09-19 DIAGNOSIS — Z1231 Encounter for screening mammogram for malignant neoplasm of breast: Secondary | ICD-10-CM

## 2021-09-21 DIAGNOSIS — G4733 Obstructive sleep apnea (adult) (pediatric): Secondary | ICD-10-CM | POA: Diagnosis not present

## 2021-09-21 DIAGNOSIS — Z23 Encounter for immunization: Secondary | ICD-10-CM | POA: Diagnosis not present

## 2021-09-21 DIAGNOSIS — F3342 Major depressive disorder, recurrent, in full remission: Secondary | ICD-10-CM | POA: Diagnosis not present

## 2021-09-21 DIAGNOSIS — Z0000 Encounter for general adult medical examination without abnormal findings: Secondary | ICD-10-CM | POA: Diagnosis not present

## 2021-09-21 DIAGNOSIS — Z96652 Presence of left artificial knee joint: Secondary | ICD-10-CM | POA: Diagnosis not present

## 2021-09-21 DIAGNOSIS — G609 Hereditary and idiopathic neuropathy, unspecified: Secondary | ICD-10-CM | POA: Diagnosis not present

## 2021-09-21 DIAGNOSIS — I10 Essential (primary) hypertension: Secondary | ICD-10-CM | POA: Diagnosis not present

## 2021-09-29 ENCOUNTER — Ambulatory Visit
Admission: RE | Admit: 2021-09-29 | Discharge: 2021-09-29 | Disposition: A | Discharge status: Home / Self Care | Payer: Medicare HMO | Source: Ambulatory Visit | Attending: *Deleted | Admitting: *Deleted

## 2021-09-29 DIAGNOSIS — Z1231 Encounter for screening mammogram for malignant neoplasm of breast: Secondary | ICD-10-CM | POA: Diagnosis not present

## 2021-12-08 DIAGNOSIS — R600 Localized edema: Secondary | ICD-10-CM | POA: Diagnosis not present

## 2021-12-08 DIAGNOSIS — M7989 Other specified soft tissue disorders: Secondary | ICD-10-CM | POA: Diagnosis not present

## 2022-01-26 DIAGNOSIS — N3946 Mixed incontinence: Secondary | ICD-10-CM | POA: Diagnosis not present

## 2022-01-26 DIAGNOSIS — N3000 Acute cystitis without hematuria: Secondary | ICD-10-CM | POA: Diagnosis not present

## 2022-01-30 DIAGNOSIS — S0012XA Contusion of left eyelid and periocular area, initial encounter: Secondary | ICD-10-CM | POA: Diagnosis not present

## 2022-01-30 DIAGNOSIS — H02403 Unspecified ptosis of bilateral eyelids: Secondary | ICD-10-CM | POA: Diagnosis not present

## 2022-01-30 DIAGNOSIS — Z961 Presence of intraocular lens: Secondary | ICD-10-CM | POA: Diagnosis not present

## 2022-02-09 DIAGNOSIS — L218 Other seborrheic dermatitis: Secondary | ICD-10-CM | POA: Diagnosis not present

## 2022-02-09 DIAGNOSIS — L638 Other alopecia areata: Secondary | ICD-10-CM | POA: Diagnosis not present

## 2022-02-09 DIAGNOSIS — L659 Nonscarring hair loss, unspecified: Secondary | ICD-10-CM | POA: Diagnosis not present

## 2022-02-23 DIAGNOSIS — L638 Other alopecia areata: Secondary | ICD-10-CM | POA: Diagnosis not present

## 2022-03-08 DIAGNOSIS — L638 Other alopecia areata: Secondary | ICD-10-CM | POA: Diagnosis not present

## 2022-03-22 DIAGNOSIS — G609 Hereditary and idiopathic neuropathy, unspecified: Secondary | ICD-10-CM | POA: Diagnosis not present

## 2022-03-22 DIAGNOSIS — E669 Obesity, unspecified: Secondary | ICD-10-CM | POA: Diagnosis not present

## 2022-03-22 DIAGNOSIS — K219 Gastro-esophageal reflux disease without esophagitis: Secondary | ICD-10-CM | POA: Diagnosis not present

## 2022-03-22 DIAGNOSIS — Z0000 Encounter for general adult medical examination without abnormal findings: Secondary | ICD-10-CM | POA: Diagnosis not present

## 2022-03-22 DIAGNOSIS — Z6834 Body mass index (BMI) 34.0-34.9, adult: Secondary | ICD-10-CM | POA: Diagnosis not present

## 2022-03-22 DIAGNOSIS — R682 Dry mouth, unspecified: Secondary | ICD-10-CM | POA: Diagnosis not present

## 2022-03-27 DIAGNOSIS — H0234 Blepharochalasis left upper eyelid: Secondary | ICD-10-CM | POA: Diagnosis not present

## 2022-03-27 DIAGNOSIS — H20012 Primary iridocyclitis, left eye: Secondary | ICD-10-CM | POA: Diagnosis not present

## 2022-03-27 DIAGNOSIS — Z961 Presence of intraocular lens: Secondary | ICD-10-CM | POA: Diagnosis not present

## 2022-03-31 DIAGNOSIS — Z961 Presence of intraocular lens: Secondary | ICD-10-CM | POA: Diagnosis not present

## 2022-03-31 DIAGNOSIS — I10 Essential (primary) hypertension: Secondary | ICD-10-CM | POA: Diagnosis not present

## 2022-03-31 DIAGNOSIS — H20012 Primary iridocyclitis, left eye: Secondary | ICD-10-CM | POA: Diagnosis not present

## 2022-04-13 DIAGNOSIS — E559 Vitamin D deficiency, unspecified: Secondary | ICD-10-CM | POA: Diagnosis not present

## 2022-04-13 DIAGNOSIS — Z79899 Other long term (current) drug therapy: Secondary | ICD-10-CM | POA: Diagnosis not present

## 2022-04-19 DIAGNOSIS — L638 Other alopecia areata: Secondary | ICD-10-CM | POA: Diagnosis not present

## 2022-04-19 DIAGNOSIS — E669 Obesity, unspecified: Secondary | ICD-10-CM | POA: Diagnosis not present

## 2022-04-24 DIAGNOSIS — H0231 Blepharochalasis right upper eyelid: Secondary | ICD-10-CM | POA: Diagnosis not present

## 2022-04-24 DIAGNOSIS — Z961 Presence of intraocular lens: Secondary | ICD-10-CM | POA: Diagnosis not present

## 2022-04-24 DIAGNOSIS — S0011XA Contusion of right eyelid and periocular area, initial encounter: Secondary | ICD-10-CM | POA: Diagnosis not present

## 2022-05-31 IMAGING — MG MM DIGITAL SCREENING BILAT W/ TOMO AND CAD
8 series · 8 of 24 positions shown · non-contrast
Comparison: Previous exam(s).

CLINICAL DATA: Screening.

EXAM:
DIGITAL SCREENING BILATERAL MAMMOGRAM WITH TOMOSYNTHESIS AND CAD
TECHNIQUE: Bilateral screening digital craniocaudal and mediolateral oblique
mammograms were obtained. Bilateral screening digital breast
tomosynthesis was performed. The images were evaluated with
computer-aided detection.

[L CC synth-2D]
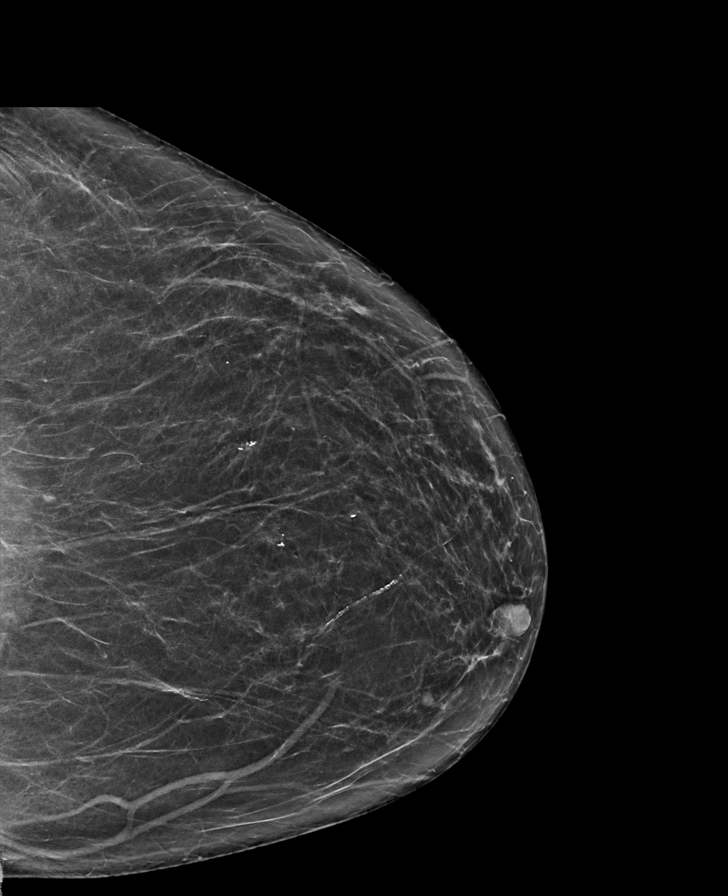

[L MLO synth-2D]
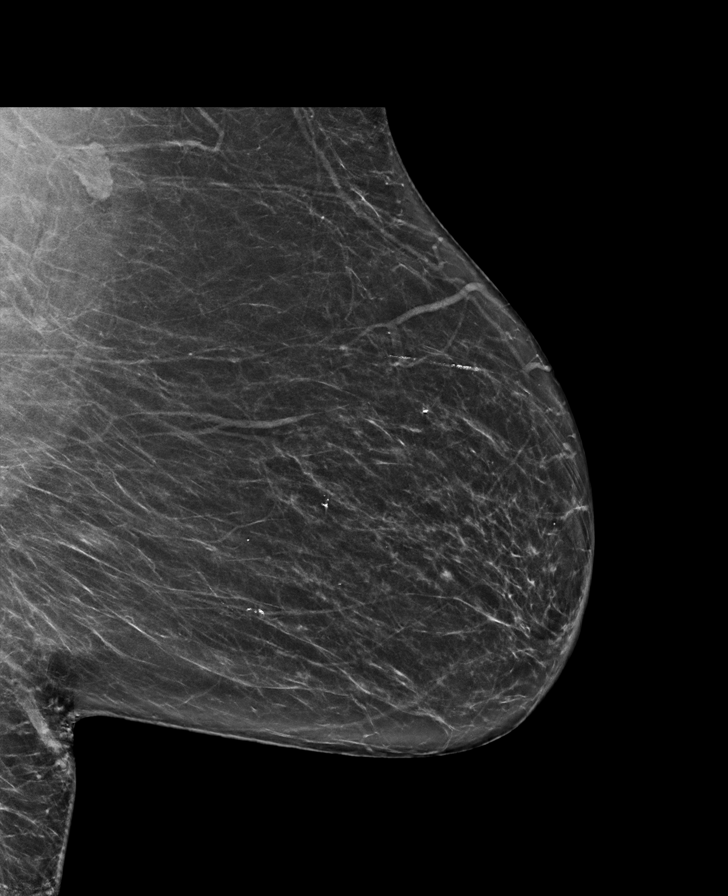

[R CC synth-2D]
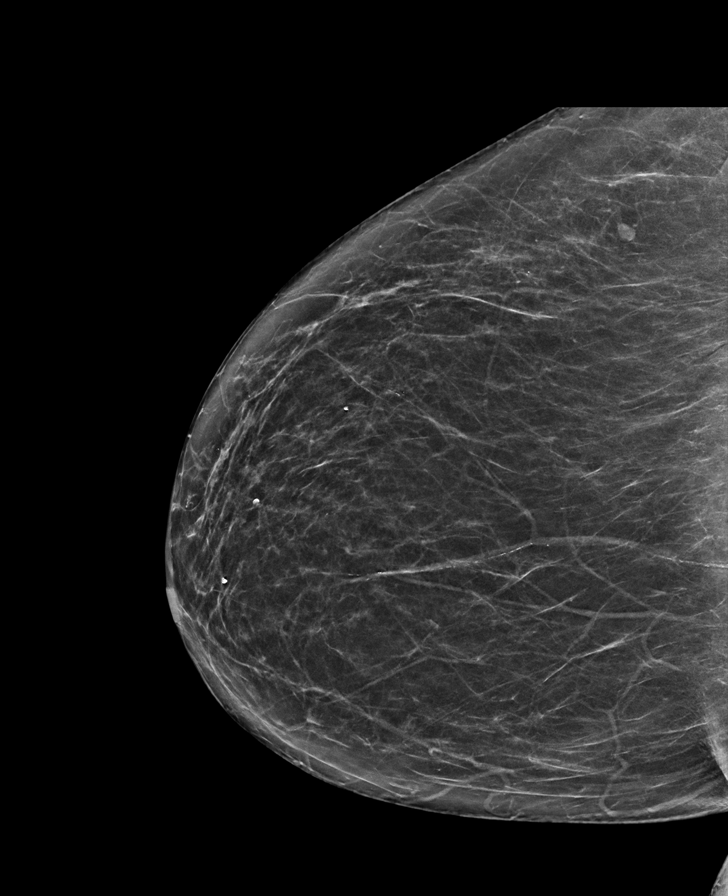

[R MLO synth-2D]
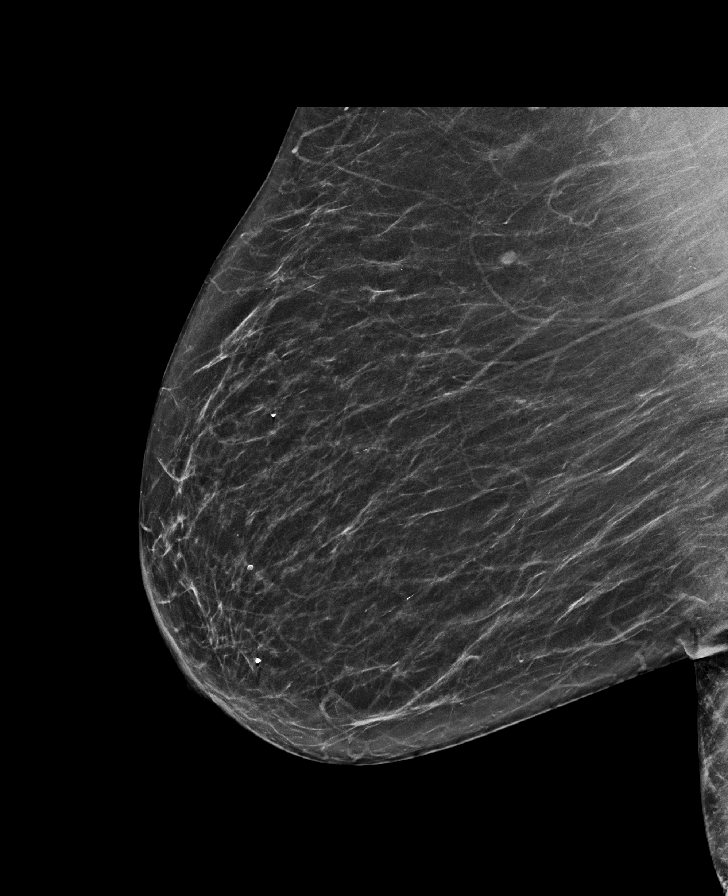

[R MLO tomo · tomo slice 33/65.0]
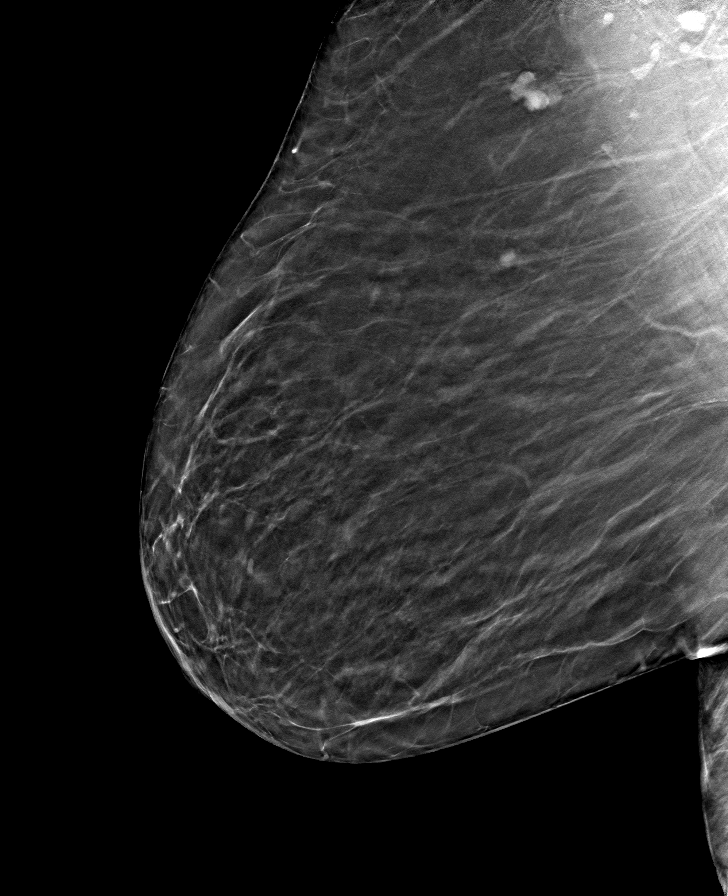

[L MLO tomo · tomo slice 33/66.0]
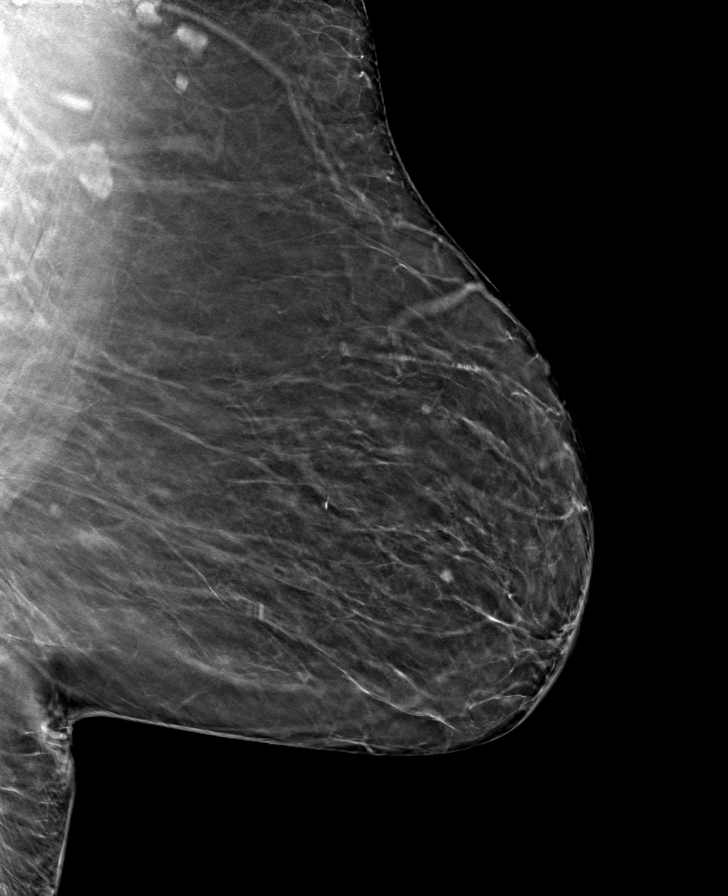

[R CC tomo · tomo slice 31/62.0]
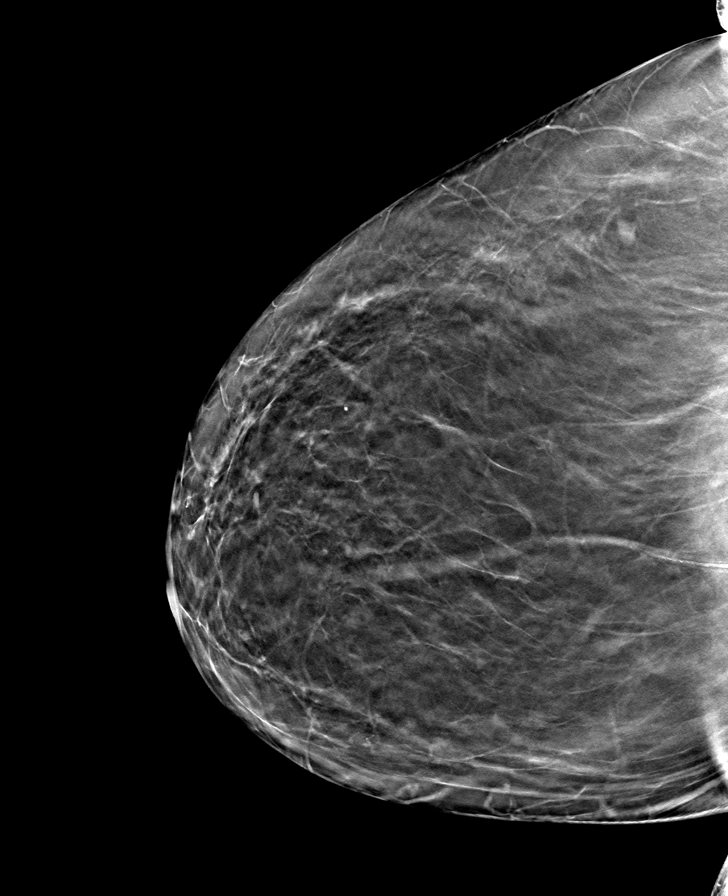

[L CC tomo · tomo slice 31/62.0]
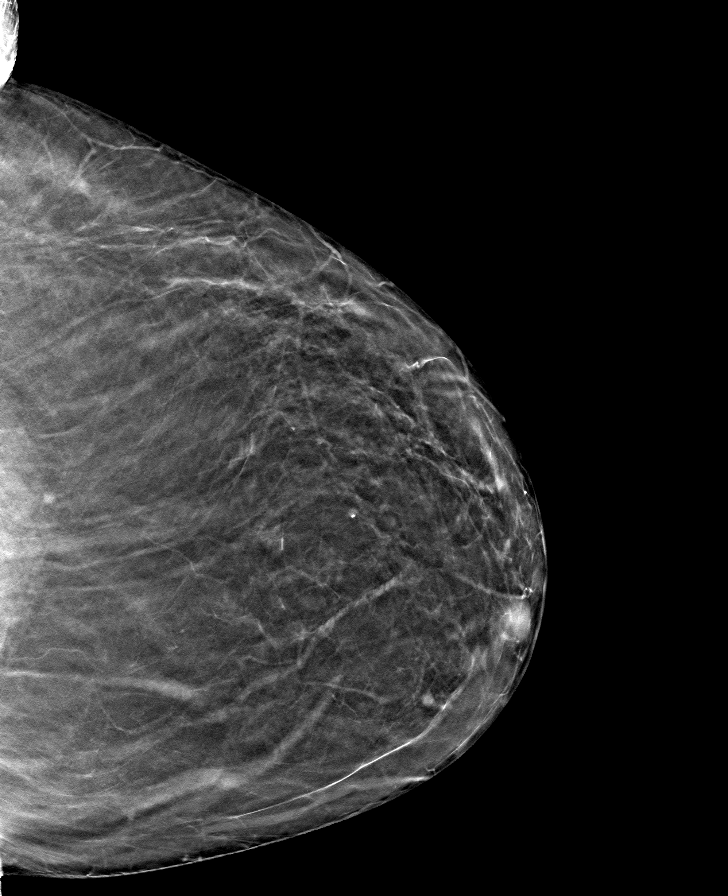

[8 of 24 positions shown; findings below may reference images not displayed]

ACR Breast Density Category b: There are scattered areas of
fibroglandular density.
FINDINGS: There are no findings suspicious for malignancy.
IMPRESSION: No mammographic evidence of malignancy. A result letter of this
screening mammogram will be mailed directly to the patient.

RECOMMENDATION:
Screening mammogram in one year. (Code:51-O-LD2)

BI-RADS CATEGORY  1: Negative.

## 2022-06-08 DIAGNOSIS — L814 Other melanin hyperpigmentation: Secondary | ICD-10-CM | POA: Diagnosis not present

## 2022-06-08 DIAGNOSIS — L821 Other seborrheic keratosis: Secondary | ICD-10-CM | POA: Diagnosis not present

## 2022-06-08 DIAGNOSIS — E669 Obesity, unspecified: Secondary | ICD-10-CM | POA: Diagnosis not present

## 2022-06-08 DIAGNOSIS — L638 Other alopecia areata: Secondary | ICD-10-CM | POA: Diagnosis not present

## 2022-06-08 DIAGNOSIS — X32XXXS Exposure to sunlight, sequela: Secondary | ICD-10-CM | POA: Diagnosis not present

## 2022-06-08 DIAGNOSIS — L218 Other seborrheic dermatitis: Secondary | ICD-10-CM | POA: Diagnosis not present

## 2022-07-03 DIAGNOSIS — L638 Other alopecia areata: Secondary | ICD-10-CM | POA: Diagnosis not present

## 2022-09-11 ENCOUNTER — Other Ambulatory Visit: Payer: Self-pay | Admitting: *Deleted

## 2022-09-11 DIAGNOSIS — Z1231 Encounter for screening mammogram for malignant neoplasm of breast: Secondary | ICD-10-CM

## 2022-10-24 DIAGNOSIS — M65331 Trigger finger, right middle finger: Secondary | ICD-10-CM | POA: Diagnosis not present

## 2022-10-24 DIAGNOSIS — M19049 Primary osteoarthritis, unspecified hand: Secondary | ICD-10-CM | POA: Diagnosis not present

## 2022-10-24 DIAGNOSIS — M65342 Trigger finger, left ring finger: Secondary | ICD-10-CM | POA: Diagnosis not present

## 2022-10-25 ENCOUNTER — Ambulatory Visit
Admission: RE | Admit: 2022-10-25 | Discharge: 2022-10-25 | Disposition: A | Discharge status: Home / Self Care | Payer: Medicare HMO | Source: Ambulatory Visit | Attending: *Deleted | Admitting: *Deleted

## 2022-10-25 DIAGNOSIS — Z1231 Encounter for screening mammogram for malignant neoplasm of breast: Secondary | ICD-10-CM | POA: Diagnosis not present

## 2022-11-03 DIAGNOSIS — M65342 Trigger finger, left ring finger: Secondary | ICD-10-CM | POA: Diagnosis not present

## 2022-11-08 DIAGNOSIS — M65342 Trigger finger, left ring finger: Secondary | ICD-10-CM | POA: Diagnosis not present

## 2022-11-13 DIAGNOSIS — Z780 Asymptomatic menopausal state: Secondary | ICD-10-CM | POA: Diagnosis not present

## 2022-12-13 DIAGNOSIS — R9431 Abnormal electrocardiogram [ECG] [EKG]: Secondary | ICD-10-CM | POA: Diagnosis not present

## 2022-12-13 DIAGNOSIS — R7989 Other specified abnormal findings of blood chemistry: Secondary | ICD-10-CM | POA: Diagnosis not present

## 2022-12-13 DIAGNOSIS — R011 Cardiac murmur, unspecified: Secondary | ICD-10-CM | POA: Diagnosis not present

## 2022-12-13 DIAGNOSIS — I10 Essential (primary) hypertension: Secondary | ICD-10-CM | POA: Diagnosis not present

## 2022-12-14 DIAGNOSIS — I493 Ventricular premature depolarization: Secondary | ICD-10-CM | POA: Diagnosis not present

## 2022-12-14 DIAGNOSIS — I491 Atrial premature depolarization: Secondary | ICD-10-CM | POA: Diagnosis not present

## 2023-02-14 DIAGNOSIS — N3946 Mixed incontinence: Secondary | ICD-10-CM | POA: Diagnosis not present

## 2023-03-02 DIAGNOSIS — I350 Nonrheumatic aortic (valve) stenosis: Secondary | ICD-10-CM | POA: Diagnosis not present

## 2023-03-02 DIAGNOSIS — R011 Cardiac murmur, unspecified: Secondary | ICD-10-CM | POA: Diagnosis not present

## 2023-03-21 DIAGNOSIS — F330 Major depressive disorder, recurrent, mild: Secondary | ICD-10-CM | POA: Diagnosis not present

## 2023-03-21 DIAGNOSIS — I5030 Unspecified diastolic (congestive) heart failure: Secondary | ICD-10-CM | POA: Diagnosis not present

## 2023-03-21 DIAGNOSIS — E261 Secondary hyperaldosteronism: Secondary | ICD-10-CM | POA: Diagnosis not present

## 2023-03-21 DIAGNOSIS — Z6832 Body mass index (BMI) 32.0-32.9, adult: Secondary | ICD-10-CM | POA: Diagnosis not present

## 2023-03-21 DIAGNOSIS — Z79899 Other long term (current) drug therapy: Secondary | ICD-10-CM | POA: Diagnosis not present

## 2023-04-06 DIAGNOSIS — I25118 Atherosclerotic heart disease of native coronary artery with other forms of angina pectoris: Secondary | ICD-10-CM | POA: Diagnosis not present

## 2023-04-06 DIAGNOSIS — G629 Polyneuropathy, unspecified: Secondary | ICD-10-CM | POA: Diagnosis not present

## 2023-04-06 DIAGNOSIS — I10 Essential (primary) hypertension: Secondary | ICD-10-CM | POA: Diagnosis not present

## 2023-04-06 DIAGNOSIS — I2510 Atherosclerotic heart disease of native coronary artery without angina pectoris: Secondary | ICD-10-CM | POA: Diagnosis not present

## 2023-04-06 DIAGNOSIS — R2681 Unsteadiness on feet: Secondary | ICD-10-CM | POA: Diagnosis not present

## 2023-04-06 DIAGNOSIS — N390 Urinary tract infection, site not specified: Secondary | ICD-10-CM | POA: Diagnosis not present

## 2023-04-06 DIAGNOSIS — G4733 Obstructive sleep apnea (adult) (pediatric): Secondary | ICD-10-CM | POA: Diagnosis not present

## 2023-04-06 DIAGNOSIS — E782 Mixed hyperlipidemia: Secondary | ICD-10-CM | POA: Diagnosis not present

## 2023-04-06 DIAGNOSIS — R413 Other amnesia: Secondary | ICD-10-CM | POA: Diagnosis not present

## 2023-04-06 DIAGNOSIS — N3946 Mixed incontinence: Secondary | ICD-10-CM | POA: Diagnosis not present

## 2023-04-06 DIAGNOSIS — I700 Atherosclerosis of aorta: Secondary | ICD-10-CM | POA: Diagnosis not present

## 2023-04-26 DIAGNOSIS — R2681 Unsteadiness on feet: Secondary | ICD-10-CM | POA: Diagnosis not present

## 2023-05-01 DIAGNOSIS — R2681 Unsteadiness on feet: Secondary | ICD-10-CM | POA: Diagnosis not present

## 2023-05-03 DIAGNOSIS — R2681 Unsteadiness on feet: Secondary | ICD-10-CM | POA: Diagnosis not present

## 2023-05-07 DIAGNOSIS — R2681 Unsteadiness on feet: Secondary | ICD-10-CM | POA: Diagnosis not present

## 2023-05-09 DIAGNOSIS — R2681 Unsteadiness on feet: Secondary | ICD-10-CM | POA: Diagnosis not present

## 2023-05-11 DIAGNOSIS — R2681 Unsteadiness on feet: Secondary | ICD-10-CM | POA: Diagnosis not present

## 2023-05-14 DIAGNOSIS — R2681 Unsteadiness on feet: Secondary | ICD-10-CM | POA: Diagnosis not present

## 2023-05-16 DIAGNOSIS — R2681 Unsteadiness on feet: Secondary | ICD-10-CM | POA: Diagnosis not present

## 2023-05-18 DIAGNOSIS — R2681 Unsteadiness on feet: Secondary | ICD-10-CM | POA: Diagnosis not present

## 2023-05-23 DIAGNOSIS — R2681 Unsteadiness on feet: Secondary | ICD-10-CM | POA: Diagnosis not present

## 2023-05-28 DIAGNOSIS — R2681 Unsteadiness on feet: Secondary | ICD-10-CM | POA: Diagnosis not present

## 2023-05-31 DIAGNOSIS — R2681 Unsteadiness on feet: Secondary | ICD-10-CM | POA: Diagnosis not present

## 2023-06-04 DIAGNOSIS — R2681 Unsteadiness on feet: Secondary | ICD-10-CM | POA: Diagnosis not present

## 2023-06-07 DIAGNOSIS — R2681 Unsteadiness on feet: Secondary | ICD-10-CM | POA: Diagnosis not present

## 2023-06-11 DIAGNOSIS — R2681 Unsteadiness on feet: Secondary | ICD-10-CM | POA: Diagnosis not present

## 2023-06-18 DIAGNOSIS — R2681 Unsteadiness on feet: Secondary | ICD-10-CM | POA: Diagnosis not present

## 2023-06-28 DIAGNOSIS — R2681 Unsteadiness on feet: Secondary | ICD-10-CM | POA: Diagnosis not present

## 2023-06-28 NOTE — Progress Notes (Deleted)
 83 y.o. G49P3003 Divorced White or Caucasian female here for breast and pelvic exam.  I am also following her for ***.  Denies vaginal bleeding.  Patient's last menstrual period was 06/26/1996.          Sexually active: {yes no:314532}  H/O STD:  {YES NO:22349}  Health Maintenance: PCP:  ****.  Last wellness appt was ***.  Did blood work at that appt:   Vaccines are up to date:  {YES NO:22349} Colonoscopy:  *** MMG:  10/25/2022 Negative BMD:  *** Last pap smear:  03/19/2019 Negative.   H/o abnormal pap smear:      reports that she has never smoked. She has never used smokeless tobacco. She reports that she does not drink alcohol and does not use drugs.  Past Medical History:  Diagnosis Date   Acid reflux    Depression    HTN (hypertension)    Left knee DJD    Neuropathy    Obesity    Post-traumatic osteoarthritis of right knee    S/P total knee replacement, left    Sleep apnea    uses CPAP   SUI (stress urinary incontinence, female)    Trigger finger     Past Surgical History:  Procedure Laterality Date   ANKLE FRACTURE SURGERY Left 04   fusion   APPENDECTOMY     BACK SURGERY     BLADDER SURGERY     BREAST EXCISIONAL BIOPSY Left 1987   No visable scar   EYE SURGERY Bilateral 14   catarcts   KNEE ARTHROSCOPY Left 12/2011   LUMBAR DISC SURGERY  12/94   TOTAL KNEE ARTHROPLASTY Left 06/09/2013   DR JANE   TOTAL KNEE ARTHROPLASTY Left 06/09/2013   Procedure: TOTAL KNEE ARTHROPLASTY;  Surgeon: Lamar DELENA Jane, MD;  Location: [facility] OR;  Service: Orthopedics;  Laterality: Left;   TOTAL KNEE ARTHROPLASTY Right 09/11/2016   Procedure: TOTAL KNEE ARTHROPLASTY;  Surgeon: Lamar Jane, MD;  Location: [facility] OR;  Service: Orthopedics;  Laterality: Right;   TUBAL LIGATION Bilateral     Current Outpatient Medications  Medication Sig Dispense Refill   alclomethasone (ACLOVATE) 0.05 % ointment      amLODipine  (NORVASC ) 5 MG tablet Take 5 mg by mouth daily.     Aspirin  (ASPIR-81  PO) Take by mouth daily.     calcium  carbonate 1250 MG capsule Take 1,250 mg by mouth 2 (two) times daily with a meal.      cholecalciferol  (VITAMIN D ) 1000 units tablet Take 1,000 Units by mouth daily.     Cranberry 200 MG CAPS Take by mouth.     hydrochlorothiazide  (HYDRODIURIL ) 25 MG tablet      losartan  (COZAAR ) 100 MG tablet      metoprolol  succinate (TOPROL -XL) 25 MG 24 hr tablet 25 mg daily.      Multiple Vitamin (MULTIVITAMIN) tablet Take 1 tablet by mouth daily.     NON FORMULARY CPAP to applied during the hours of sleep     omeprazole (PRILOSEC) 40 MG capsule Take 40 mg by mouth 2 (two) times daily.      TOVIAZ  8 MG TB24 tablet Take 8 mg by mouth daily.     venlafaxine (EFFEXOR) 37.5 MG tablet Take by mouth.     vitamin C (ASCORBIC ACID) 250 MG tablet Take 250 mg by mouth daily.     No current facility-administered medications for this visit.    Family History  Problem Relation Age of Onset   Heart disease Mother  Heart attack Mother    Stroke Mother    Heart disease Father    Heart attack Father    Hypertension Other    Clotting disorder Sister        sister died of a PE post op    Review of Systems  Exam:   LMP 06/26/1996      General appearance: alert, cooperative and appears stated age Breasts: {Exam; breast:13139::normal appearance, no masses or tenderness} Abdomen: soft, non-tender; bowel sounds normal; no masses,  no organomegaly Lymph nodes: Cervical, supraclavicular, and axillary nodes normal.  No abnormal inguinal nodes palpated Neurologic: Grossly normal  Pelvic: External genitalia:  no lesions              Urethra:  normal appearing urethra with no masses, tenderness or lesions              Bartholins and Skenes: normal                 Vagina: normal appearing vagina with atrophic changes ***and no discharge, no lesions              Cervix: {exam; cervix:14595}              Pap taken: {yes no:314532} Bimanual Exam:  Uterus:  {exam;  uterus:12215}              Adnexa: {exam; adnexa:12223}               Rectovaginal: Confirms               Anus:  normal sphincter tone, no lesions  Chaperone, ***, CMA, was present for exam.  Assessment/Plan: There are no diagnoses linked to this encounter.

## 2023-06-29 ENCOUNTER — Ambulatory Visit (HOSPITAL_BASED_OUTPATIENT_CLINIC_OR_DEPARTMENT_OTHER): Payer: BC Managed Care – PPO | Admitting: Obstetrics & Gynecology

## 2023-07-02 DIAGNOSIS — R2681 Unsteadiness on feet: Secondary | ICD-10-CM | POA: Diagnosis not present

## 2023-07-05 DIAGNOSIS — R2681 Unsteadiness on feet: Secondary | ICD-10-CM | POA: Diagnosis not present

## 2023-07-10 DIAGNOSIS — R2681 Unsteadiness on feet: Secondary | ICD-10-CM | POA: Diagnosis not present

## 2023-07-12 DIAGNOSIS — R2681 Unsteadiness on feet: Secondary | ICD-10-CM | POA: Diagnosis not present

## 2023-07-17 DIAGNOSIS — R2681 Unsteadiness on feet: Secondary | ICD-10-CM | POA: Diagnosis not present

## 2023-07-19 DIAGNOSIS — R2681 Unsteadiness on feet: Secondary | ICD-10-CM | POA: Diagnosis not present

## 2023-07-24 DIAGNOSIS — R2681 Unsteadiness on feet: Secondary | ICD-10-CM | POA: Diagnosis not present

## 2023-07-26 DIAGNOSIS — R2681 Unsteadiness on feet: Secondary | ICD-10-CM | POA: Diagnosis not present

## 2023-07-31 DIAGNOSIS — R2681 Unsteadiness on feet: Secondary | ICD-10-CM | POA: Diagnosis not present

## 2023-08-02 DIAGNOSIS — R2681 Unsteadiness on feet: Secondary | ICD-10-CM | POA: Diagnosis not present

## 2023-08-07 DIAGNOSIS — R2681 Unsteadiness on feet: Secondary | ICD-10-CM | POA: Diagnosis not present

## 2023-08-09 DIAGNOSIS — R2681 Unsteadiness on feet: Secondary | ICD-10-CM | POA: Diagnosis not present

## 2023-08-10 DIAGNOSIS — Z8249 Family history of ischemic heart disease and other diseases of the circulatory system: Secondary | ICD-10-CM | POA: Diagnosis not present

## 2023-08-10 DIAGNOSIS — G629 Polyneuropathy, unspecified: Secondary | ICD-10-CM | POA: Diagnosis not present

## 2023-08-10 DIAGNOSIS — M1990 Unspecified osteoarthritis, unspecified site: Secondary | ICD-10-CM | POA: Diagnosis not present

## 2023-08-10 DIAGNOSIS — E785 Hyperlipidemia, unspecified: Secondary | ICD-10-CM | POA: Diagnosis not present

## 2023-08-10 DIAGNOSIS — I25119 Atherosclerotic heart disease of native coronary artery with unspecified angina pectoris: Secondary | ICD-10-CM | POA: Diagnosis not present

## 2023-08-10 DIAGNOSIS — I700 Atherosclerosis of aorta: Secondary | ICD-10-CM | POA: Diagnosis not present

## 2023-08-10 DIAGNOSIS — F324 Major depressive disorder, single episode, in partial remission: Secondary | ICD-10-CM | POA: Diagnosis not present

## 2023-08-10 DIAGNOSIS — Z008 Encounter for other general examination: Secondary | ICD-10-CM | POA: Diagnosis not present

## 2023-08-10 DIAGNOSIS — K219 Gastro-esophageal reflux disease without esophagitis: Secondary | ICD-10-CM | POA: Diagnosis not present

## 2023-08-10 DIAGNOSIS — I739 Peripheral vascular disease, unspecified: Secondary | ICD-10-CM | POA: Diagnosis not present

## 2023-08-10 DIAGNOSIS — Z809 Family history of malignant neoplasm, unspecified: Secondary | ICD-10-CM | POA: Diagnosis not present

## 2023-08-10 DIAGNOSIS — E669 Obesity, unspecified: Secondary | ICD-10-CM | POA: Diagnosis not present

## 2023-08-10 DIAGNOSIS — F419 Anxiety disorder, unspecified: Secondary | ICD-10-CM | POA: Diagnosis not present

## 2023-08-20 DIAGNOSIS — I5032 Chronic diastolic (congestive) heart failure: Secondary | ICD-10-CM | POA: Diagnosis not present

## 2023-08-20 DIAGNOSIS — N390 Urinary tract infection, site not specified: Secondary | ICD-10-CM | POA: Diagnosis not present

## 2023-08-20 DIAGNOSIS — K635 Polyp of colon: Secondary | ICD-10-CM | POA: Diagnosis not present

## 2023-08-20 DIAGNOSIS — I700 Atherosclerosis of aorta: Secondary | ICD-10-CM | POA: Diagnosis not present

## 2023-08-20 DIAGNOSIS — I25118 Atherosclerotic heart disease of native coronary artery with other forms of angina pectoris: Secondary | ICD-10-CM | POA: Diagnosis not present

## 2023-08-20 DIAGNOSIS — M2550 Pain in unspecified joint: Secondary | ICD-10-CM | POA: Diagnosis not present

## 2023-08-20 DIAGNOSIS — I2510 Atherosclerotic heart disease of native coronary artery without angina pectoris: Secondary | ICD-10-CM | POA: Diagnosis not present

## 2023-08-20 DIAGNOSIS — G629 Polyneuropathy, unspecified: Secondary | ICD-10-CM | POA: Diagnosis not present

## 2023-08-20 DIAGNOSIS — N3946 Mixed incontinence: Secondary | ICD-10-CM | POA: Diagnosis not present

## 2023-08-20 DIAGNOSIS — I10 Essential (primary) hypertension: Secondary | ICD-10-CM | POA: Diagnosis not present

## 2023-08-20 DIAGNOSIS — E782 Mixed hyperlipidemia: Secondary | ICD-10-CM | POA: Diagnosis not present

## 2023-08-20 DIAGNOSIS — I110 Hypertensive heart disease with heart failure: Secondary | ICD-10-CM | POA: Diagnosis not present

## 2023-08-20 DIAGNOSIS — R413 Other amnesia: Secondary | ICD-10-CM | POA: Diagnosis not present

## 2023-08-21 DIAGNOSIS — R2681 Unsteadiness on feet: Secondary | ICD-10-CM | POA: Diagnosis not present

## 2023-08-28 DIAGNOSIS — R2681 Unsteadiness on feet: Secondary | ICD-10-CM | POA: Diagnosis not present

## 2023-08-30 DIAGNOSIS — R2681 Unsteadiness on feet: Secondary | ICD-10-CM | POA: Diagnosis not present

## 2023-09-04 DIAGNOSIS — R2681 Unsteadiness on feet: Secondary | ICD-10-CM | POA: Diagnosis not present

## 2023-09-06 ENCOUNTER — Telehealth (HOSPITAL_COMMUNITY): Payer: Self-pay | Admitting: Cardiology

## 2023-09-06 DIAGNOSIS — R2681 Unsteadiness on feet: Secondary | ICD-10-CM | POA: Diagnosis not present

## 2023-09-06 NOTE — Telephone Encounter (Signed)
 Called patient at [phone] to schedule this patient a NEW CHF appointment with Dr. Elwyn Lade from referral received by [facility] office.   Patient did not answer her telephone / patient did not answer the telephone call.   Front office left a detailed voice message explaining the referral received by [facility], and [facility] office number to call back to schedule the appt.

## 2023-09-11 DIAGNOSIS — R2681 Unsteadiness on feet: Secondary | ICD-10-CM | POA: Diagnosis not present

## 2023-09-13 DIAGNOSIS — R2681 Unsteadiness on feet: Secondary | ICD-10-CM | POA: Diagnosis not present

## 2023-09-13 NOTE — Telephone Encounter (Signed)
 Pt left VM on triage line Returning call to schedule appt   Returned call to patient No answer-LMOM

## 2023-09-18 DIAGNOSIS — R2681 Unsteadiness on feet: Secondary | ICD-10-CM | POA: Diagnosis not present

## 2023-09-25 DIAGNOSIS — R2681 Unsteadiness on feet: Secondary | ICD-10-CM | POA: Diagnosis not present

## 2023-09-25 NOTE — Telephone Encounter (Signed)
 Patient left VM on triage line confirming 4/2 appt

## 2023-09-26 ENCOUNTER — Encounter (HOSPITAL_COMMUNITY): Payer: Self-pay | Admitting: Cardiology

## 2023-09-26 ENCOUNTER — Ambulatory Visit (HOSPITAL_COMMUNITY)
Admission: RE | Admit: 2023-09-26 | Discharge: 2023-09-26 | Disposition: A | Discharge status: Home / Self Care | Source: Ambulatory Visit | Attending: Cardiology | Admitting: Cardiology

## 2023-09-26 VITALS — BP 126/84 | HR 81 | Ht 62.5 in | Wt 191.4 lb

## 2023-09-26 DIAGNOSIS — R600 Localized edema: Secondary | ICD-10-CM | POA: Insufficient documentation

## 2023-09-26 DIAGNOSIS — Z9181 History of falling: Secondary | ICD-10-CM | POA: Insufficient documentation

## 2023-09-26 DIAGNOSIS — Z87440 Personal history of urinary (tract) infections: Secondary | ICD-10-CM | POA: Diagnosis not present

## 2023-09-26 DIAGNOSIS — I509 Heart failure, unspecified: Secondary | ICD-10-CM | POA: Diagnosis not present

## 2023-09-26 DIAGNOSIS — I10 Essential (primary) hypertension: Secondary | ICD-10-CM | POA: Diagnosis not present

## 2023-09-26 DIAGNOSIS — Z79899 Other long term (current) drug therapy: Secondary | ICD-10-CM | POA: Insufficient documentation

## 2023-09-26 DIAGNOSIS — I5032 Chronic diastolic (congestive) heart failure: Secondary | ICD-10-CM | POA: Diagnosis not present

## 2023-09-26 DIAGNOSIS — I110 Hypertensive heart disease with heart failure: Secondary | ICD-10-CM | POA: Diagnosis not present

## 2023-09-26 MED ORDER — SPIRONOLACTONE 25 MG PO TABS: 25.0000 mg | ORAL_TABLET | Freq: Every day | ORAL | 3 refills | Status: AC

## 2023-09-26 MED ORDER — AMLODIPINE BESYLATE 5 MG PO TABS: 5.0000 mg | ORAL_TABLET | Freq: Every day | ORAL | 3 refills | Status: DC

## 2023-09-26 NOTE — Patient Instructions (Signed)
 Medication Changes:  DECREASE AMLODIPINE TO 5MG  ONCE DAILY   STOP HYDROCHLOROTHIAZIDE   START SPIRONOLACTONE 25MG  ONCE DAILY   Lab Work:  RETURN FOR LABS IN 2 WEEKS AS SCHEDULED   Follow-Up in: 3 MONTHS WITH AN ECHOCARDIOGRAM WITH DR. Elwyn Lade PLEASE CALL OUR OFFICE AROUND JUNE TO GET SCHEDULED FOR YOUR APPOINTMENT. PHONE NUMBER IS [phone] OPTION 2   At the [facility], you and your health needs are our priority. We have a designated team specialized in the treatment of Heart Failure. This Care Team includes your primary Heart Failure Specialized Cardiologist (physician), Advanced Practice Providers (APPs- Physician Assistants and Nurse Practitioners), and Pharmacist who all work together to provide you with the care you need, when you need it.   You may see any of the following providers on your designated Care Team at your next follow up:  Dr. Arvilla Meres Dr. Marca Ancona Dr. Dorthula Nettles Dr. Theresia Bough Tonye Becket, NP Robbie Lis, [place] [facility] [place], [place] Brynda Peon, NP [place] Lee, NP Karle Plumber, PharmD   Please be sure to bring in all your medications bottles to every appointment.   Need to Contact us:  If you have any questions or concerns before your next appointment please send [place] a message through [place] or call our office at [phone].    TO LEAVE A MESSAGE FOR THE NURSE SELECT OPTION 2, PLEASE LEAVE A MESSAGE INCLUDING: YOUR NAME DATE OF BIRTH CALL BACK NUMBER REASON FOR CALL**this is important as we prioritize the call backs  YOU WILL RECEIVE A CALL BACK THE SAME DAY AS LONG AS YOU CALL BEFORE 4:00 PM

## 2023-09-26 NOTE — Progress Notes (Incomplete)
   ADVANCED HEART FAILURE NEW PATIENT CLINIC NOTE  Referring Physician: No ref. provider found  Primary Care: No primary care provider on file. Primary Cardiologist:  HPI: Jennifer Dyer is a 83 y.o. female with a PMH of HTN who presents for initial visit for further evaluation and treatment of heart failure/cardiomyopathy.      Patient was initially referred to cardiology in 2024 for assessment of abnormal BNP and EKG. She has a history of hypertension as well. She did not have symptoms of orthopnea or PND at the time, but had occasional peripheral swelling thought to be more related to venous insufficiency.      SUBJECTIVE:  Patient presents to clinic due to concern over lower extremity swelling and high normal BNP value. She reports that overall she is doing well. She does get occasionally short of breath with exertion but is able to complete her daily actvities without issue. She has no prior cardiac history besides hypertension, reports that her BP has been occasionally elevated at home. She is concerned about having HF as her mother had it. She does have some mild lower extremity swelling that is worse throughout the day, improves when she elevates her bed at night. Drinks a lot of diet soda, not as much water. Reports occasional history of falls, no syncopal events, but does get dizzy when standing and walking around occasionally.   PMH, current medications, allergies, social history, and family history reviewed in epic.  PHYSICAL EXAM: Vitals:   09/26/23 1349  BP: 126/84  Pulse: 81  SpO2: 99%   GENERAL: Elderly, NAD PULM:  Normal WOB CARDIAC:  JVP: flat         Normal rate with regular rhythm. No murmurs, rubs or gallops.  1+ non pitting edema. Warm and well perfused extremities. ABDOMEN: Soft, non-tender, non-distended. NEUROLOGIC: Patient is oriented x3 with no focal or lateralizing neurologic deficits.    DATA REVIEW  ECG: 09/26/23: NSR, LVH    ECHO: 03/02/23: LVEF  55-60%, Grade I DD, normal IVC size, normal RV size and function  CATH: None     ASSESSMENT & PLAN:  Lower extremity edema: Suspect more related to amlodipine than heart failure given appearance on exam, improvement overnight.  - Decrease amlodipine as below  Hypertension: Reports some mildly elevated pressures. History of falls at home and occasional dizziness.  - Stop HCTZ given elderly patient with history of falls - Decrease amlodipine to 5mg  daily - Start spironolactone 25mg  daily - Continue losartan 100mg  daily - Labs in 2 weeks for monitoring  Suspicion for HFpEF: Previously normal echocardiogram. Age and occasional orthostatic symptoms raise the question of amyloid, but EKG and exam are not concerning. If echocardiogram benign can likely follow up prn. - echocardiogram at next visit - History of UTI so no SGLT-2 - No need for diuretics  Follow up in 3 months and then likely prn  I spent 48 minutes reviewing the patient's chart, looking up records from [facility], discussion with patient and family, arranging follow up.   Clearnce Hasten, MD Advanced Heart Failure Mechanical Circulatory Support 09/28/23

## 2023-10-03 ENCOUNTER — Other Ambulatory Visit: Payer: Self-pay | Admitting: Endocrinology

## 2023-10-03 DIAGNOSIS — Z1231 Encounter for screening mammogram for malignant neoplasm of breast: Secondary | ICD-10-CM

## 2023-10-10 ENCOUNTER — Ambulatory Visit (HOSPITAL_COMMUNITY)
Admission: RE | Admit: 2023-10-10 | Discharge: 2023-10-10 | Disposition: A | Discharge status: Home / Self Care | Source: Ambulatory Visit | Attending: Cardiology | Admitting: Cardiology

## 2023-10-10 DIAGNOSIS — I509 Heart failure, unspecified: Secondary | ICD-10-CM | POA: Insufficient documentation

## 2023-10-10 LAB — BASIC METABOLIC PANEL WITH GFR
Anion gap: 10 (ref 5–15)
BUN: 34 mg/dL — ABNORMAL HIGH (ref 8–23)
CO2: 25 mmol/L (ref 22–32)
Calcium: 9.6 mg/dL (ref 8.9–10.3)
Chloride: 106 mmol/L (ref 98–111)
Creatinine, Ser: 1.16 mg/dL — ABNORMAL HIGH (ref 0.44–1.00)
GFR, Estimated: 47 mL/min — ABNORMAL LOW (ref >60)
Glucose, Bld: 118 mg/dL — ABNORMAL HIGH (ref 70–99)
Potassium: 4.4 mmol/L (ref 3.5–5.1)
Sodium: 141 mmol/L (ref 135–145)

## 2023-10-10 LAB — BRAIN NATRIURETIC PEPTIDE: B Natriuretic Peptide: 120.4 pg/mL — ABNORMAL HIGH (ref 0.0–100.0)

## 2023-10-26 ENCOUNTER — Ambulatory Visit
Admission: RE | Admit: 2023-10-26 | Discharge: 2023-10-26 | Disposition: A | Discharge status: Home / Self Care | Source: Ambulatory Visit | Attending: Endocrinology | Admitting: Endocrinology

## 2023-10-26 DIAGNOSIS — Z1231 Encounter for screening mammogram for malignant neoplasm of breast: Secondary | ICD-10-CM | POA: Diagnosis not present

## 2024-01-04 NOTE — Telephone Encounter (Signed)
 Called pt, left message, needs to schedule her mwv for next available.  Electronically signed by: Sarah D Spurgeon 01/04/2024 2:22 PM

## 2024-01-11 NOTE — Telephone Encounter (Signed)
 No call back from pt, will send letter to contact the office to schedule.  Electronically signed by: Lauraine JONETTA Loosen 01/11/2024 10:39 AM

## 2024-02-22 DIAGNOSIS — G3184 Mild cognitive impairment, so stated: Secondary | ICD-10-CM | POA: Diagnosis not present

## 2024-02-22 DIAGNOSIS — I25118 Atherosclerotic heart disease of native coronary artery with other forms of angina pectoris: Secondary | ICD-10-CM | POA: Diagnosis not present

## 2024-02-22 DIAGNOSIS — R413 Other amnesia: Secondary | ICD-10-CM | POA: Diagnosis not present

## 2024-02-22 DIAGNOSIS — N390 Urinary tract infection, site not specified: Secondary | ICD-10-CM | POA: Diagnosis not present

## 2024-02-22 DIAGNOSIS — I110 Hypertensive heart disease with heart failure: Secondary | ICD-10-CM | POA: Diagnosis not present

## 2024-02-22 DIAGNOSIS — R2681 Unsteadiness on feet: Secondary | ICD-10-CM | POA: Diagnosis not present

## 2024-02-22 DIAGNOSIS — I5032 Chronic diastolic (congestive) heart failure: Secondary | ICD-10-CM | POA: Diagnosis not present

## 2024-03-10 ENCOUNTER — Ambulatory Visit (HOSPITAL_COMMUNITY)
Admission: RE | Admit: 2024-03-10 | Discharge: 2024-03-10 | Disposition: A | Discharge status: Home / Self Care | Source: Ambulatory Visit | Attending: Cardiology | Admitting: Cardiology

## 2024-03-10 DIAGNOSIS — I110 Hypertensive heart disease with heart failure: Secondary | ICD-10-CM | POA: Diagnosis not present

## 2024-03-10 DIAGNOSIS — I351 Nonrheumatic aortic (valve) insufficiency: Secondary | ICD-10-CM | POA: Diagnosis not present

## 2024-03-10 DIAGNOSIS — I509 Heart failure, unspecified: Secondary | ICD-10-CM | POA: Insufficient documentation

## 2024-03-10 DIAGNOSIS — G4730 Sleep apnea, unspecified: Secondary | ICD-10-CM | POA: Insufficient documentation

## 2024-03-10 LAB — ECHOCARDIOGRAM COMPLETE
Area-P 1/2: 3.27 cm2
S' Lateral: 3.12 cm

## 2024-03-10 NOTE — Progress Notes (Signed)
  Echocardiogram 2D Echocardiogram has been performed.  Koleen KANDICE Popper, RDCS 03/10/2024, 3:50 PM

## 2024-03-11 ENCOUNTER — Ambulatory Visit (HOSPITAL_COMMUNITY): Payer: Self-pay | Admitting: Cardiology

## 2024-03-13 ENCOUNTER — Telehealth (HOSPITAL_COMMUNITY): Payer: Self-pay | Admitting: Cardiology

## 2024-03-13 DIAGNOSIS — M25562 Pain in left knee: Secondary | ICD-10-CM | POA: Diagnosis not present

## 2024-03-13 DIAGNOSIS — M25572 Pain in left ankle and joints of left foot: Secondary | ICD-10-CM | POA: Diagnosis not present

## 2024-03-13 DIAGNOSIS — M79672 Pain in left foot: Secondary | ICD-10-CM | POA: Diagnosis not present

## 2024-03-13 DIAGNOSIS — L03116 Cellulitis of left lower limb: Secondary | ICD-10-CM | POA: Diagnosis not present

## 2024-03-13 NOTE — Telephone Encounter (Signed)
 Called to confirm/remind patient of their appointment at the [facility] on 03/13/2024.   Appointment:   [] Confirmed  [x] Left mess   [] No answer/No voice mail  [] VM Full/unable to leave message  [] Phone not in service  Patient reminded to bring all medications and/or complete list.  Confirmed patient has transportation. Gave directions, instructed to utilize valet parking.

## 2024-03-14 ENCOUNTER — Ambulatory Visit (HOSPITAL_COMMUNITY)
Admission: RE | Admit: 2024-03-14 | Discharge: 2024-03-14 | Disposition: A | Discharge status: Home / Self Care | Source: Ambulatory Visit | Attending: Cardiology | Admitting: Cardiology

## 2024-03-14 ENCOUNTER — Encounter (HOSPITAL_COMMUNITY): Payer: Self-pay | Admitting: Cardiology

## 2024-03-14 VITALS — BP 150/84 | HR 74 | Ht 62.5 in | Wt 191.0 lb

## 2024-03-14 DIAGNOSIS — I10 Essential (primary) hypertension: Secondary | ICD-10-CM | POA: Diagnosis not present

## 2024-03-14 DIAGNOSIS — R600 Localized edema: Secondary | ICD-10-CM | POA: Diagnosis not present

## 2024-03-14 DIAGNOSIS — Z87440 Personal history of urinary (tract) infections: Secondary | ICD-10-CM | POA: Diagnosis not present

## 2024-03-14 DIAGNOSIS — Z79899 Other long term (current) drug therapy: Secondary | ICD-10-CM | POA: Diagnosis not present

## 2024-03-14 DIAGNOSIS — I5030 Unspecified diastolic (congestive) heart failure: Secondary | ICD-10-CM | POA: Diagnosis not present

## 2024-03-14 DIAGNOSIS — M7989 Other specified soft tissue disorders: Secondary | ICD-10-CM | POA: Diagnosis not present

## 2024-03-14 DIAGNOSIS — I110 Hypertensive heart disease with heart failure: Secondary | ICD-10-CM | POA: Insufficient documentation

## 2024-03-14 DIAGNOSIS — I872 Venous insufficiency (chronic) (peripheral): Secondary | ICD-10-CM | POA: Insufficient documentation

## 2024-03-18 NOTE — Progress Notes (Signed)
   ADVANCED HEART FAILURE FOLLOW UP CLINIC NOTE  Referring Physician: Nichole Senior, MD  Primary Care: Nichole Senior, MD Primary Cardiologist:  HPI: Jennifer Dyer is a 83 y.o. female who presents for follow up of leg swelling.      Patient was initially referred to cardiology in 2024 for assessment of abnormal BNP and EKG. She has a history of hypertension as well. She did not have symptoms of orthopnea or PND at the time, but had occasional peripheral swelling thought to be more related to venous insufficiency.      SUBJECTIVE:  Patient overall reports that she is doing well.  Her breathing has improved, though had a recent fall in her house and has a boot on.  Reports some swelling of the leg that got injured, but otherwise her venous insufficiency is largely stable.  Improved somewhat since decreasing amlodipine .  We discussed her recently normal echocardiogram.  PMH, current medications, allergies, social history, and family history reviewed in epic.  PHYSICAL EXAM: Vitals:   03/14/24 1537  BP: (!) 150/84  Pulse: 74  SpO2: 96%   GENERAL: Well nourished and in no apparent distress at rest.  PULM:  Normal work of breathing, clear to auscultation bilaterally. Respirations are unlabored.  CARDIAC:  JVP: Flat         Normal rate with regular rhythm. No murmurs, rubs or gallops.  Dependent edema, worse on the left . Warm and well perfused extremities. ABDOMEN: Soft, non-tender, non-distended. NEUROLOGIC: Patient is oriented x3 with no focal or lateralizing neurologic deficits.    DATA REVIEW  ECG: 09/26/23: NSR, LVH     ECHO: 03/02/23: LVEF 55-60%, Grade I DD, normal IVC size, normal RV size and function 03/10/2024: LVEF 55-60%, normal GLS, normal diastolic function, normal RV   CATH: None   ASSESSMENT & PLAN:  Lower extremity edema: Suspect more related to amlodipine  than heart failure given appearance on exam, improvement overnight.  - Continue amlodipine  5mg  daily    Hypertension: Reports some mildly elevated pressures, elevated here in clinic today. - Continue amlodipine  5mg  daily - Continue spironolactone  25mg  daily - Continue losartan  100mg  daily - Defer further escalation to PCP, especially given recent falls   Mild HFpEF: Previously normal echocardiogram. Age and occasional orthostatic symptoms raise the question of amyloid, but EKG and exam are not concerning. Echocardiogram largely benign.  - No need for advanced heart failure follow up - History of UTI so no SGLT-2 - No need for diuretics  Follow up as needed  Jennifer Brownie, MD Advanced Heart Failure Mechanical Circulatory Support 03/18/24

## 2024-03-24 ENCOUNTER — Telehealth (HOSPITAL_COMMUNITY): Payer: Self-pay

## 2024-03-24 NOTE — Telephone Encounter (Signed)
 Received a fax requesting medical records from [facility]. Records were successfully faxed to: [phone] ,which was the number provided.. Medical request form will be scanned into patients chart.   Phone number: [phone]

## 2024-03-24 NOTE — Progress Notes (Unsigned)
 [facility]  PATIENT: Jennifer Dyer DOB: [birth year]  REFERRING DOCTOR OR PCP: Garnette Ore MD SOURCE: Patient, notes from primary care, lab results, no recent imaging  _________________________________   HISTORICAL  CHIEF COMPLAINT:  Chief Complaint  Patient presents with   New Patient (Initial Visit)    Pt in room 10. Daughters in room. Urgent paper referral for assessment to be able to get into SNF - memory concerns, unsteady gait. MOCA:24    HISTORY OF PRESENT ILLNESS:  I had the pleasure of seeing your patient, Jennifer Dyer, at [facility] for neurologic consultation regarding her memory loss and gait disturbance.  She is an 83 year old woman with hypertension and congestive heart failure referred for mild cognitive impairment.    Ms. Lohn first noted some cognitive issues when she started missing appointments around 2023.   She also began to note difficulty coming up with names.   She denies issues handling money though daughter notes she stopped keeping meticulous records and checkbook up to date a couple years ago.     Her family notes the cognitivive issues accelerated this year (2025).   She is in the process of moving to [facility].  A neurologic evaluation has been requested.  She does not drive.  She also has problems with her gait.   She has had more falls the past couple years (no injury) and now uses a walker.   Without her walker, she has a reduced stride since last year.  She feels she has mild left leg weakness.   She has been told she has neuropathywith hands > feet.   She has had some bladder issues since childbirth many years ago but this has worsened the last couple years.   She has been on Toviaz  for the past 5 years and notes benefit  She has had depression off/on since her 20's   She sometimes gets a little agitated but not associated with confusion.  She has no hallucinations/psychosis.   She has  obstructive sleep apnea but no longer uses CPAP.  She reports sleeping well at night.  She keeps a fairly regular 24-hour cycle.  She has no FH of AD or other demntia but no one lived into late years.     She has no recent imaging records.  Over the past few months she has had lab work including B12 and TSH that were normal     03/27/2024    3:24 PM  Montreal Cognitive Assessment   Visuospatial/ Executive (0/5) 5  Naming (0/3) 3  Attention: Read list of digits (0/2) 2  Attention: Read list of letters (0/1) 1  Attention: Serial 7 subtraction starting at 100 (0/3) 2  Language: Repeat phrase (0/2) 0  Language : Fluency (0/1) 1  Abstraction (0/2) 2  Delayed Recall (0/5) 2  Orientation (0/6) 6  Total 24   20 minutes later she recalled was also 2/5 without prompt and one more with a prompt.     Imaging: No recent relevant imaging   REVIEW OF SYSTEMS: Constitutional: No fevers, chills, sweats, or change in appetite Eyes: No visual changes, double vision, eye pain Ear, nose and throat: Bilateral hearing loss. Cardiovascular: No chest pain, palpitations Respiratory:  No shortness of breath at rest or with exertion.   No wheezes GastrointestinaI: No nausea, vomiting, diarrhea, abdominal pain, fecal incontinence Genitourinary: She has urinary incontinence.  Toviaz  has helped. Musculoskeletal:  No neck pain, back pain Integumentary: No rash, pruritus, skin  lesions Neurological: as above Psychiatric: No depression at this time.  No anxiety Endocrine: No palpitations, diaphoresis, change in appetite, change in weigh or increased thirst Hematologic/Lymphatic:  No anemia, purpura, petechiae. Allergic/Immunologic: No itchy/runny eyes, nasal congestion, recent allergic reactions, rashes  ALLERGIES: Allergies  Allergen Reactions   Nitrofurantoin     hives    HOME MEDICATIONS:  Current Outpatient Medications:    amLODipine  (NORVASC ) 5 MG tablet, Take 1 tablet (5 mg total) by mouth  daily., Disp: 30 tablet, Rfl: 3   Aspirin  (ASPIR-81 PO), Take by mouth daily., Disp: , Rfl:    calcium  carbonate 1250 MG capsule, Take 1,250 mg by mouth 2 (two) times daily with a meal. , Disp: , Rfl:    cholecalciferol  (VITAMIN D ) 1000 units tablet, Take 1,000 Units by mouth daily., Disp: , Rfl:    furosemide (LASIX) 20 MG tablet, Take 20 mg by mouth daily., Disp: , Rfl:    losartan  (COZAAR ) 100 MG tablet, , Disp: , Rfl:    metoprolol  succinate (TOPROL -XL) 25 MG 24 hr tablet, 25 mg daily. , Disp: , Rfl:    mirtazapine (REMERON) 15 MG tablet, Take 15 mg by mouth at bedtime., Disp: , Rfl:    Multiple Vitamin (MULTIVITAMIN) tablet, Take 1 tablet by mouth daily., Disp: , Rfl:    nitrofurantoin, macrocrystal-monohydrate, (MACROBID) 100 MG capsule, Take 100 mg by mouth daily., Disp: , Rfl:    NON FORMULARY, CPAP to applied during the hours of sleep, Disp: , Rfl:    omeprazole (PRILOSEC) 40 MG capsule, Take 40 mg by mouth 2 (two) times daily.  (Patient taking differently: Take 40 mg by mouth as needed.), Disp: , Rfl:    OXYBUTYNIN CHLORIDE ER PO, Take 10 mg by mouth daily., Disp: , Rfl:    spironolactone  (ALDACTONE ) 25 MG tablet, Take 1 tablet (25 mg total) by mouth daily., Disp: 90 tablet, Rfl: 3   TOVIAZ  8 MG TB24 tablet, Take 8 mg by mouth daily., Disp: , Rfl:    venlafaxine (EFFEXOR) 37.5 MG tablet, Take by mouth., Disp: , Rfl:   PAST MEDICAL HISTORY: Past Medical History:  Diagnosis Date   Acid reflux    Depression    Foot fracture, left    2025   HTN (hypertension)    Left knee DJD    Neuropathy    Obesity    Post-traumatic osteoarthritis of right knee    S/P total knee replacement, left    Sleep apnea    uses CPAP   SUI (stress urinary incontinence, female)    Trigger finger     PAST SURGICAL HISTORY: Past Surgical History:  Procedure Laterality Date   ANKLE FRACTURE SURGERY Left 04   fusion   APPENDECTOMY     BACK SURGERY     BLADDER SURGERY     BREAST EXCISIONAL BIOPSY  Left 1987   No visable scar   EYE SURGERY Bilateral 14   catarcts   KNEE ARTHROSCOPY Left 12/2011   LUMBAR DISC SURGERY  12/94   TOTAL KNEE ARTHROPLASTY Left 06/09/2013   DR JANE   TOTAL KNEE ARTHROPLASTY Left 06/09/2013   Procedure: TOTAL KNEE ARTHROPLASTY;  Surgeon: Lamar DELENA JANE, MD;  Location: [facility] OR;  Service: Orthopedics;  Laterality: Left;   TOTAL KNEE ARTHROPLASTY Right 09/11/2016   Procedure: TOTAL KNEE ARTHROPLASTY;  Surgeon: Lamar JANE, MD;  Location: [facility] OR;  Service: Orthopedics;  Laterality: Right;   TUBAL LIGATION Bilateral     FAMILY HISTORY: Family History  Problem  Relation Age of Onset   Heart disease Mother    Heart attack Mother    Stroke Mother    Heart disease Father    Heart attack Father    Clotting disorder Sister        sister died of a PE post op   Hypertension Other    Breast cancer Neg Hx    BRCA 1/2 Neg Hx     SOCIAL HISTORY: Social History   Socioeconomic History   Marital status: Divorced    Spouse name: Not on file   Number of children: 3   Years of education: Not on file   Highest education level: Not on file  Occupational History    Employer: RETIRED  Tobacco Use   Smoking status: Never   Smokeless tobacco: Never  Vaping Use   Vaping status: Never Used  Substance and Sexual Activity   Alcohol use: No   Drug use: No   Sexual activity: Not Currently    Partners: Male    Birth control/protection: Post-menopausal, Surgical    Comment: btl  Other Topics Concern   Not on file  Social History Narrative   Not on file   Social Drivers of Health   Financial Resource Strain: Not on file  Food Insecurity: Low Risk  (12/13/2022)   Received from [facility]   Hunger Vital Sign    Within the past 12 months, you worried that your food would run out before you got money to buy more: Never true    Within the past 12 months, the food you bought just didn't last and you didn't have money to get more. : Never true  Transportation  Needs: Not on file (12/13/2022)  Physical Activity: Not on file  Stress: Not on file  Social Connections: Not on file  Intimate Partner Violence: Not on file       PHYSICAL EXAM  Vitals:   03/27/24 1519  BP: 130/76  Pulse: 80  SpO2: 100%  Weight: 199 lb 8 oz (90.5 kg)  Height: 5' 2.5 (1.588 m)    Body mass index is 35.91 kg/m.   General: The patient is well-developed and well-nourished and in no acute distress  HEENT:  Head is Terril/AT.  Sclera are anicteric.   Neck: No carotid bruits are noted.  The neck is nontender.  Cardiovascular: The heart has a regular rate and rhythm with a normal S1 and S2. There were no murmurs, gallops or rubs.    Skin: Extremities are without rash or  edema.  Musculoskeletal:  Back is nontender  Neurologic Exam  Mental status: The patient is alert and oriented x 3 at the time of the examination.  She has reduced short-term memory, focus/attention.  She scored 24/30 on the [facility] cognitive assessment.   Speech is normal.  Cranial nerves: Extraocular movements are full. Pupils are equal, round, and reactive to light and accomodation.  Visual fields are full.  Facial symmetry is present. There is good facial sensation to soft touch bilaterally.Facial strength is normal.  Trapezius and sternocleidomastoid strength is normal. No dysarthria is noted.  The tongue is midline, and the patient has symmetric elevation of the soft palate.  She has reduced hearing.  Motor:  Muscle bulk is normal.   Tone is normal. Strength is  5 / 5 in all 4 extremities.   Sensory: Sensory testing is intact to pinprick, soft touch and vibration sensation in all 4 extremities.  Coordination: Cerebellar testing reveals good finger-nose-finger and  heel-to-shin bilaterally.  Gait and station: She needs to use her arms to stand up from the chair.  Although she uses a walker, she is able to take steps in the room.  The steps have a reduced stride.  She takes 6 steps to turn  180 degrees.  She has significant retropulsion.  Romberg is mildly positive.   Reflexes: Deep tendon reflexes are symmetric and normal bilaterally.   Plantar responses are flexor.    DIAGNOSTIC DATA (LABS, IMAGING, TESTING) - I reviewed patient records, labs, notes, testing and imaging myself where available.  Lab Results  Component Value Date   WBC 8.4 09/18/2016   HGB 11.7 (A) 09/18/2016   HCT 35 (A) 09/18/2016   MCV 88.6 09/13/2016   PLT 329 09/18/2016      Component Value Date/Time   NA 141 10/10/2023 1344   NA 144 09/18/2016 0000   K 4.4 10/10/2023 1344   CL 106 10/10/2023 1344   CO2 25 10/10/2023 1344   GLUCOSE 118 (H) 10/10/2023 1344   BUN 34 (H) 10/10/2023 1344   BUN 18 09/18/2016 0000   CREATININE 1.16 (H) 10/10/2023 1344   CALCIUM  9.6 10/10/2023 1344   PROT 6.8 09/01/2016 1500   ALBUMIN 3.8 09/01/2016 1500   AST 15 09/01/2016 1500   ALT 13 (L) 09/01/2016 1500   ALKPHOS 68 09/01/2016 1500   BILITOT 1.0 09/01/2016 1500   GFRNONAA 47 (L) 10/10/2023 1344   GFRAA >60 09/13/2016 0515   No results found for: CHOL, HDL, LDLCALC, LDLDIRECT, TRIG, CHOLHDL No results found for: YHAJ8R No results found for: VITAMINB12 No results found for: TSH     ASSESSMENT AND PLAN  Memory loss - Plan: ATN PROFILE, MR BRAIN WO CONTRAST  Gait disturbance - Plan: ATN PROFILE, MR BRAIN WO CONTRAST  Essential hypertension  OSA (obstructive sleep apnea)  Urinary incontinence, unspecified type  Depression with anxiety    In summary, Ms. Hegstrom is an 83 year old woman who has had memory, gait and urinary function decline over the past couple of years.  On the [facility] cognitive assessment, she scored 24/30 consistent with fairly mild cognitive impairment.  I discussed with her and her daughters that mild cognitive impairment is not severe enough to be considered dementia.  For some patients, this can be a stage between normal cognitive function and dementia.   For other patients, this could be due to reversible medical issues, mood disturbance, medication side effects or poor sleep.  She does have sleep apnea but does not use CPAP which could be contributing.  Additionally, she does have some mood disturbance.  The anticholinergic medications for her bladder (which are useful to her) might also be playing some role.  I am concerned that the combination of cognitive, gait and urinary issues could also be due to a process such as normal pressure hydrocephalus.  Due to the mildness of the cognitive impairment, she should be able to go to regular assisted living.  I do not think she needs a memory unit We need to check a brain imaging study to determine if there is any evidence of normal pressure hydrocephalus or other treatable structural issue such as bilateral subdural hematomas.  This will also allow us  to evaluate for the possibility of multi-infarct changes.  We will check an MRI of the brain without contrast.  If she has ventriculomegaly out of proportion to the atrophy, consider high-volume lumbar puncture Because some patients with mild cognitive impairment are in a transitional stage towards  dementia, we will still check amyloid beta 42/40 ratio and [id number].  Although she has retropulsion on examination, she did not have other evidence of Lewy body disease. If she has evidence of mild Alzheimer's on the biomarkers or if symptoms worsen, consider adding donepezil 5 mg and increasing to 10 mg if tolerated. If symptom worsens, consider restarting CPAP therapy or holding the anticholinergic therapy.   Return as needed for significant new or worsening neurologic symptoms.   Bernadene Garside A. Vear, MD, [facility] 03/27/2024, 8:32 PM Certified in Neurology, Clinical Neurophysiology, Sleep Medicine and Neuroimaging  [facility] [address], [address] [place], [place] [address] [phone]

## 2024-03-26 ENCOUNTER — Encounter: Payer: Self-pay | Admitting: *Deleted

## 2024-03-27 ENCOUNTER — Ambulatory Visit (INDEPENDENT_AMBULATORY_CARE_PROVIDER_SITE_OTHER): Admitting: Neurology

## 2024-03-27 ENCOUNTER — Encounter: Payer: Self-pay | Admitting: Neurology

## 2024-03-27 VITALS — BP 130/76 | HR 80 | Ht 62.5 in | Wt 199.5 lb

## 2024-03-27 DIAGNOSIS — R269 Unspecified abnormalities of gait and mobility: Secondary | ICD-10-CM

## 2024-03-27 DIAGNOSIS — R32 Unspecified urinary incontinence: Secondary | ICD-10-CM | POA: Diagnosis not present

## 2024-03-27 DIAGNOSIS — I10 Essential (primary) hypertension: Secondary | ICD-10-CM | POA: Diagnosis not present

## 2024-03-27 DIAGNOSIS — G4733 Obstructive sleep apnea (adult) (pediatric): Secondary | ICD-10-CM | POA: Diagnosis not present

## 2024-03-27 DIAGNOSIS — F418 Other specified anxiety disorders: Secondary | ICD-10-CM | POA: Diagnosis not present

## 2024-03-27 DIAGNOSIS — R413 Other amnesia: Secondary | ICD-10-CM | POA: Diagnosis not present

## 2024-04-01 DIAGNOSIS — M25572 Pain in left ankle and joints of left foot: Secondary | ICD-10-CM | POA: Diagnosis not present

## 2024-04-01 DIAGNOSIS — L03116 Cellulitis of left lower limb: Secondary | ICD-10-CM | POA: Diagnosis not present

## 2024-04-01 DIAGNOSIS — M25562 Pain in left knee: Secondary | ICD-10-CM | POA: Diagnosis not present

## 2024-04-01 DIAGNOSIS — M79672 Pain in left foot: Secondary | ICD-10-CM | POA: Diagnosis not present

## 2024-04-02 LAB — ATN PROFILE
A -- Beta-amyloid 42/40 Ratio: 0.11 (ref >0.102)
Beta-amyloid 40: 223.74 pg/mL
Beta-amyloid 42: 24.6 pg/mL
N -- NfL, Plasma: 15.1 pg/mL — AB (ref 0.00–9.13)
T -- p-tau181: 2.4 pg/mL — AB (ref 0.00–0.97)

## 2024-04-04 ENCOUNTER — Ambulatory Visit: Payer: Self-pay | Admitting: Neurology

## 2024-04-04 DIAGNOSIS — N390 Urinary tract infection, site not specified: Secondary | ICD-10-CM | POA: Diagnosis not present

## 2024-04-04 DIAGNOSIS — R351 Nocturia: Secondary | ICD-10-CM | POA: Diagnosis not present

## 2024-04-04 DIAGNOSIS — R350 Frequency of micturition: Secondary | ICD-10-CM | POA: Diagnosis not present

## 2024-04-09 NOTE — Telephone Encounter (Addendum)
 Called Daughter and let her know the below result note.    ----- Message from [facility] F sent at 04/08/2024  2:10 PM EDT -----  ----- Message ----- From: Vear Charlie LABOR, MD Sent: 04/07/2024   4:38 PM EDT To: Nevelyn Meissner, CMA  An MRI of the brain was also ordered at the last visit.  Based on the results of the MRI we will decide whether or not a lumbar puncture needs to be done. ----- Message ----- From: Meissner Nevelyn, CMA Sent: 04/07/2024  10:30 AM EDT To: Charlie LABOR Vear, MD  Called and spoke to : [place] Daughter [phone]   Relayed results/recommendations. Pt daughter voiced understanding. Pt daughter would like to know if you were planning to do an LP? Could you also clarify what the lp would like to know what that's  used for to diagnose.  ----- Message ----- From: Vear Charlie LABOR, MD Sent: 04/04/2024   4:02 PM EDT To: Gna-[facility] 1 Results  Please let her know that the test we did for Alzheimer's disease did not show the abnormality that is seen most commonly with Alzheimer's.  I do recommend that she try to use her CPAP as sleep apnea  can affect cognition/memory, especially in older individuals ----- Message ----- From: Interface, [facility] Lab Results In Sent: 03/29/2024  11:35 AM EDT To: Charlie LABOR Vear, MD

## 2024-05-05 DIAGNOSIS — K117 Disturbances of salivary secretion: Secondary | ICD-10-CM | POA: Diagnosis not present

## 2024-05-05 DIAGNOSIS — I872 Venous insufficiency (chronic) (peripheral): Secondary | ICD-10-CM | POA: Diagnosis not present

## 2024-05-05 DIAGNOSIS — G4733 Obstructive sleep apnea (adult) (pediatric): Secondary | ICD-10-CM | POA: Diagnosis not present

## 2024-05-05 DIAGNOSIS — H6120 Impacted cerumen, unspecified ear: Secondary | ICD-10-CM | POA: Diagnosis not present

## 2024-05-05 DIAGNOSIS — I509 Heart failure, unspecified: Secondary | ICD-10-CM | POA: Diagnosis not present

## 2024-05-05 DIAGNOSIS — Z792 Long term (current) use of antibiotics: Secondary | ICD-10-CM | POA: Diagnosis not present

## 2024-05-05 DIAGNOSIS — R4189 Other symptoms and signs involving cognitive functions and awareness: Secondary | ICD-10-CM | POA: Diagnosis not present

## 2024-05-05 DIAGNOSIS — E669 Obesity, unspecified: Secondary | ICD-10-CM | POA: Diagnosis not present

## 2024-05-05 DIAGNOSIS — I110 Hypertensive heart disease with heart failure: Secondary | ICD-10-CM | POA: Diagnosis not present

## 2024-05-05 DIAGNOSIS — F4322 Adjustment disorder with anxiety: Secondary | ICD-10-CM | POA: Diagnosis not present

## 2024-05-07 DIAGNOSIS — H6123 Impacted cerumen, bilateral: Secondary | ICD-10-CM | POA: Diagnosis not present

## 2024-05-21 DIAGNOSIS — Z1331 Encounter for screening for depression: Secondary | ICD-10-CM | POA: Diagnosis not present

## 2024-05-21 DIAGNOSIS — G4733 Obstructive sleep apnea (adult) (pediatric): Secondary | ICD-10-CM | POA: Diagnosis not present

## 2024-05-21 DIAGNOSIS — I509 Heart failure, unspecified: Secondary | ICD-10-CM | POA: Diagnosis not present

## 2024-05-21 DIAGNOSIS — Z7189 Other specified counseling: Secondary | ICD-10-CM | POA: Diagnosis not present

## 2024-06-20 ENCOUNTER — Other Ambulatory Visit (HOSPITAL_COMMUNITY): Payer: Self-pay | Admitting: Cardiology

## 2024-06-20 DIAGNOSIS — I509 Heart failure, unspecified: Secondary | ICD-10-CM
